# Patient Record
Sex: Male | Born: 1935 | Race: White | Hispanic: No | Marital: Married | Smoking: Former smoker
Health system: Southern US, Community
[De-identification: ages and names within clinical notes are randomized; demographics above are authoritative.]

## PROBLEM LIST (undated history)

## (undated) DIAGNOSIS — I739 Peripheral vascular disease, unspecified: Secondary | ICD-10-CM

## (undated) DIAGNOSIS — I2510 Atherosclerotic heart disease of native coronary artery without angina pectoris: Secondary | ICD-10-CM

## (undated) DIAGNOSIS — I872 Venous insufficiency (chronic) (peripheral): Secondary | ICD-10-CM

## (undated) DIAGNOSIS — I442 Atrioventricular block, complete: Secondary | ICD-10-CM

## (undated) DIAGNOSIS — I10 Essential (primary) hypertension: Secondary | ICD-10-CM

## (undated) DIAGNOSIS — Z952 Presence of prosthetic heart valve: Secondary | ICD-10-CM

## (undated) DIAGNOSIS — E785 Hyperlipidemia, unspecified: Secondary | ICD-10-CM

## (undated) DIAGNOSIS — E1143 Type 2 diabetes mellitus with diabetic autonomic (poly)neuropathy: Secondary | ICD-10-CM

## (undated) DIAGNOSIS — I4891 Unspecified atrial fibrillation: Secondary | ICD-10-CM

## (undated) DIAGNOSIS — E119 Type 2 diabetes mellitus without complications: Secondary | ICD-10-CM

## (undated) HISTORY — DX: Unspecified atrial fibrillation: I48.91

## (undated) HISTORY — DX: Essential (primary) hypertension: I10

## (undated) HISTORY — PX: FEMORAL ARTERY - FEMORAL ARTERY BYPASS GRAFT: SUR179

## (undated) HISTORY — DX: Venous insufficiency (chronic) (peripheral): I87.2

## (undated) HISTORY — DX: Type 2 diabetes mellitus without complications: E11.9

## (undated) HISTORY — DX: Atrioventricular block, complete: I44.2

## (undated) HISTORY — PX: CORONARY ARTERY BYPASS GRAFT: SHX141

## (undated) HISTORY — DX: Peripheral vascular disease, unspecified: I73.9

## (undated) HISTORY — DX: Hyperlipidemia, unspecified: E78.5

## (undated) HISTORY — DX: Type 2 diabetes mellitus with diabetic autonomic (poly)neuropathy: E11.43

## (undated) HISTORY — DX: Presence of prosthetic heart valve: Z95.2

## (undated) HISTORY — DX: Atherosclerotic heart disease of native coronary artery without angina pectoris: I25.10

---

## 1997-08-01 ENCOUNTER — Encounter (HOSPITAL_COMMUNITY): Admission: RE | Admit: 1997-08-01 | Discharge: 1997-10-30 | Payer: Self-pay | Admitting: Hematology and Oncology

## 1997-11-01 ENCOUNTER — Encounter (HOSPITAL_COMMUNITY): Admission: RE | Admit: 1997-11-01 | Discharge: 1998-01-30 | Payer: Self-pay | Admitting: Hematology and Oncology

## 1998-01-31 ENCOUNTER — Encounter (HOSPITAL_COMMUNITY): Admission: RE | Admit: 1998-01-31 | Discharge: 1998-05-01 | Payer: Self-pay | Admitting: Hematology and Oncology

## 1998-05-02 ENCOUNTER — Encounter (HOSPITAL_COMMUNITY): Admission: RE | Admit: 1998-05-02 | Discharge: 1998-07-31 | Payer: Self-pay | Admitting: Otolaryngology

## 1998-08-01 ENCOUNTER — Encounter (HOSPITAL_COMMUNITY): Admission: RE | Admit: 1998-08-01 | Discharge: 1998-10-30 | Payer: Self-pay | Admitting: Otolaryngology

## 1998-10-31 ENCOUNTER — Encounter (HOSPITAL_COMMUNITY): Admission: RE | Admit: 1998-10-31 | Discharge: 1998-12-01 | Payer: Self-pay | Admitting: Hematology and Oncology

## 1998-12-02 ENCOUNTER — Encounter (HOSPITAL_COMMUNITY): Admission: RE | Admit: 1998-12-02 | Discharge: 1999-03-02 | Payer: Self-pay | Admitting: Hematology and Oncology

## 2002-01-22 ENCOUNTER — Encounter (INDEPENDENT_AMBULATORY_CARE_PROVIDER_SITE_OTHER): Payer: Self-pay

## 2002-01-22 ENCOUNTER — Ambulatory Visit (HOSPITAL_COMMUNITY): Admission: RE | Admit: 2002-01-22 | Discharge: 2002-01-22 | Payer: Self-pay | Admitting: *Deleted

## 2002-11-13 ENCOUNTER — Encounter (INDEPENDENT_AMBULATORY_CARE_PROVIDER_SITE_OTHER): Payer: Self-pay | Admitting: Cardiology

## 2002-11-13 ENCOUNTER — Ambulatory Visit (HOSPITAL_COMMUNITY): Admission: RE | Admit: 2002-11-13 | Discharge: 2002-11-13 | Payer: Self-pay | Admitting: Cardiology

## 2004-12-14 ENCOUNTER — Inpatient Hospital Stay (HOSPITAL_COMMUNITY): Admission: EM | Admit: 2004-12-14 | Discharge: 2004-12-16 | Payer: Self-pay | Admitting: Emergency Medicine

## 2004-12-15 ENCOUNTER — Encounter (INDEPENDENT_AMBULATORY_CARE_PROVIDER_SITE_OTHER): Payer: Self-pay | Admitting: *Deleted

## 2005-05-23 ENCOUNTER — Inpatient Hospital Stay (HOSPITAL_COMMUNITY): Admission: EM | Admit: 2005-05-23 | Discharge: 2005-05-25 | Payer: Self-pay | Admitting: Emergency Medicine

## 2005-05-24 DIAGNOSIS — I442 Atrioventricular block, complete: Secondary | ICD-10-CM

## 2005-05-24 HISTORY — DX: Atrioventricular block, complete: I44.2

## 2005-05-24 HISTORY — PX: PACEMAKER INSERTION: SHX728

## 2005-06-17 ENCOUNTER — Encounter (HOSPITAL_COMMUNITY): Admission: RE | Admit: 2005-06-17 | Discharge: 2005-09-15 | Payer: Self-pay | Admitting: *Deleted

## 2005-09-16 ENCOUNTER — Encounter (HOSPITAL_COMMUNITY): Admission: RE | Admit: 2005-09-16 | Discharge: 2005-12-15 | Payer: Self-pay | Admitting: *Deleted

## 2008-08-27 ENCOUNTER — Ambulatory Visit: Admission: RE | Admit: 2008-08-27 | Discharge: 2008-08-27 | Payer: Self-pay | Admitting: Podiatry

## 2008-08-27 ENCOUNTER — Ambulatory Visit: Payer: Self-pay | Admitting: Vascular Surgery

## 2008-08-27 ENCOUNTER — Encounter (INDEPENDENT_AMBULATORY_CARE_PROVIDER_SITE_OTHER): Payer: Self-pay | Admitting: Podiatry

## 2009-05-29 ENCOUNTER — Encounter: Admission: RE | Admit: 2009-05-29 | Discharge: 2009-05-29 | Payer: Self-pay | Admitting: Foot & Ankle Surgery

## 2009-12-03 ENCOUNTER — Ambulatory Visit: Payer: Self-pay | Admitting: Cardiology

## 2009-12-31 ENCOUNTER — Ambulatory Visit: Payer: Self-pay | Admitting: *Deleted

## 2010-01-28 ENCOUNTER — Ambulatory Visit: Payer: Self-pay | Admitting: Cardiology

## 2010-02-25 ENCOUNTER — Ambulatory Visit: Payer: Self-pay | Admitting: Cardiology

## 2010-02-28 ENCOUNTER — Encounter: Payer: Self-pay | Admitting: Internal Medicine

## 2010-03-24 ENCOUNTER — Ambulatory Visit: Payer: Self-pay | Admitting: Cardiology

## 2010-04-01 ENCOUNTER — Encounter: Admission: RE | Admit: 2010-04-01 | Discharge: 2010-04-01 | Payer: Self-pay | Admitting: Internal Medicine

## 2010-04-08 ENCOUNTER — Ambulatory Visit: Payer: Self-pay | Admitting: Internal Medicine

## 2010-04-21 ENCOUNTER — Ambulatory Visit: Payer: Self-pay | Admitting: Cardiology

## 2010-05-06 ENCOUNTER — Ambulatory Visit: Payer: Self-pay | Admitting: Cardiology

## 2010-05-25 ENCOUNTER — Ambulatory Visit (HOSPITAL_COMMUNITY)
Admission: RE | Admit: 2010-05-25 | Discharge: 2010-05-25 | Payer: Self-pay | Source: Home / Self Care | Attending: Neurosurgery | Admitting: Neurosurgery

## 2010-06-02 ENCOUNTER — Ambulatory Visit: Payer: Self-pay | Admitting: Cardiology

## 2010-06-02 NOTE — Miscellaneous (Signed)
Summary: Device preload  Clinical Lists Changes  Observations: Added new observation of PPM INDICATN: CHB (02/28/2010 13:57) Added new observation of MAGNET RTE: BOL 85 ERI 65 (02/28/2010 13:57) Added new observation of PPMLEADSTAT2: active (02/28/2010 13:57) Added new observation of PPMLEADSER2: [id number] (02/28/2010 13:57) Added new observation of PPMLEADMOD2: 5076  (02/28/2010 13:57) Added new observation of PPMLEADDOI2: 05/24/2005  (02/28/2010 13:57) Added new observation of PPMLEADLOC2: RV  (02/28/2010 13:57) Added new observation of PPMLEADSTAT1: active  (02/28/2010 13:57) Added new observation of PPMLEADSER1: [id number]  (02/28/2010 13:57) Added new observation of PPMLEADMOD1: 5076  (02/28/2010 13:57) Added new observation of PPMLEADDOI1: 05/24/2005  (02/28/2010 13:57) Added new observation of PPMLEADLOC1: RA  (02/28/2010 13:57) Added new observation of PPM DOI: 05/24/2005  (02/28/2010 13:57) Added new observation of PPM SERL#: [id number]  (02/28/2010 13:57) Added new observation of PPM MODL#: [id number]  (02/28/2010 13:57) Added new observation of PACEMAKERMFG: Medtronic  (02/28/2010 13:57) Added new observation of PPM IMP MD: Charlynn Court  (02/28/2010 13:57) Added new observation of PPM REFER MD: Rolla Plate  (02/28/2010 13:57) Added new observation of PACEMAKER MD: Hillis Range, MD  (02/28/2010 13:57)      PPM Specifications Following MD:  Hillis Range, MD     Referring MD:  Rolla Plate PPM Vendor:  Medtronic     PPM Model Number:  [id number]     PPM Serial Number:  [id number] PPM DOI:  05/24/2005     PPM Implanting MD:  Charlynn Court  Lead 1    Location: RA     DOI: 05/24/2005     Model #: [id number]     Serial #: [id number]     Status: active Lead 2    Location: RV     DOI: 05/24/2005     Model #: [id number]     Serial #: [id number]     Status: active  Magnet Response Rate:  BOL 85 ERI 65  Indications:  CHB

## 2010-06-04 NOTE — Procedures (Signed)
Summary: pacer check/medtronic   Current Medications (verified): 1)  Metoprolol Tartrate 50 Mg Tabs (Metoprolol Tartrate) .... Take 1 Tablet By Mouth Two Times A Day 2)  Furosemide 40 Mg Tabs (Furosemide) .... Take 1 Tablet By Mouth Two Times A Day 3)  Diovan 160 Mg Tabs (Valsartan) .... Take 1 Tablet By Mouth Once Daily 4)  Metformin Hcl 500 Mg Tabs (Metformin Hcl) .... Take 2 Tablets By Mouth At Breakfast 5)  Actos 15 Mg Tabs (Pioglitazone Hcl) .... Take 1 Tablet By Mouth At Dinner 6)  Lipitor 40 Mg Tabs (Atorvastatin Calcium) .... Take 1 Tablet By Mouth At Bedtime 7)  Aspirin 81 Mg Tbec (Aspirin) .... Take 1 Tablet By Mouth Once Daily 8)  Folic Acid 1 Mg Tabs (Folic Acid) .... Take 1 Tablet By Mouth Once Daily 9)  Lumigan 0.03 % Soln (Bimatoprost) .... One Drop in Each At Bedtime 10)  Vitamin D 400 Unit Tabs (Cholecalciferol) .... Take 1 Tablet By Mouth Once Daily 11)  Fish Oil Concentrate 1000 Mg Caps (Omega-3 Fatty Acids) .... Take 2 Tablets By Mouth Once Daily 12)  Warfarin Sodium 5 Mg Tabs (Warfarin Sodium) .... Take As Directed By Coumadin Clinic 13)  Tylenol Extra Strength 500 Mg Tabs (Acetaminophen) .... Take As Needed 14)  Aleve 220 Mg Tabs (Naproxen Sodium) .... Take As Needed  Allergies (verified): No Known Drug Allergies  PPM Specifications Following MD:  Hillis Range, MD     Referring MD:  Rolla Plate PPM Vendor:  Medtronic     PPM Model Number:  [id number]     PPM Serial Number:  [id number] PPM DOI:  05/24/2005     PPM Implanting MD:  Charlynn Court  Lead 1    Location: RA     DOI: 05/24/2005     Model #: [id number]     Serial #: [id number]     Status: active Lead 2    Location: RV     DOI: 05/24/2005     Model #: [id number]     Serial #: [id number]     Status: active  Magnet Response Rate:  BOL 85 ERI 65  Indications:  CHB   PPM Follow Up Battery Voltage:  2.92 V       PPM Device Measurements Atrium  Amplitude: 0.8 mV, Impedance: 464 ohms,  Right Ventricle   Amplitude: 13.9 mV, Impedance: 536 ohms, Threshold: 1.0 V at 0.4 msec  Episodes Percent Mode Switch:  100%     Coumadin:  Yes Ventricular High Rate:  0     Ventricular Pacing:  92.8%  Parameters Mode:  VVIR     Lower Rate Limit:  70     Next Cardiology Appt Due:  07/30/2010 Tech Comments:  [place] [facility] PT---PT IN AF 100% OF TIME. + COUMADIN.  NORMAL DEVICE FUNCTION.  NO CHANGES MADE. ROV 07-30-10 @ 1200 W/JA. PT ENROLLED IN [facility] AND WILL START TRANSMISSIONS AFTER JA OV IN [facility]. Vella Kohler  April 09, 2010 8:32 AM

## 2010-06-04 NOTE — Cardiovascular Report (Signed)
Summary: Office Visit   Office Visit   Imported By: Roderic Ovens 04/14/2010 09:12:25  _____________________________________________________________________  External Attachment:    Type:   Image     Comment:   External Document

## 2010-06-25 HISTORY — PX: CAROTID ENDARTERECTOMY: SUR193

## 2010-07-13 ENCOUNTER — Encounter (INDEPENDENT_AMBULATORY_CARE_PROVIDER_SITE_OTHER): Payer: PRIVATE HEALTH INSURANCE

## 2010-07-13 DIAGNOSIS — Z7901 Long term (current) use of anticoagulants: Secondary | ICD-10-CM

## 2010-07-13 DIAGNOSIS — I4891 Unspecified atrial fibrillation: Secondary | ICD-10-CM

## 2010-07-27 ENCOUNTER — Other Ambulatory Visit (INDEPENDENT_AMBULATORY_CARE_PROVIDER_SITE_OTHER): Payer: Medicare Other | Admitting: *Deleted

## 2010-07-27 DIAGNOSIS — Z7901 Long term (current) use of anticoagulants: Secondary | ICD-10-CM

## 2010-07-27 DIAGNOSIS — I4891 Unspecified atrial fibrillation: Secondary | ICD-10-CM | POA: Insufficient documentation

## 2010-07-30 ENCOUNTER — Encounter: Payer: Self-pay | Admitting: Internal Medicine

## 2010-07-30 ENCOUNTER — Ambulatory Visit (INDEPENDENT_AMBULATORY_CARE_PROVIDER_SITE_OTHER): Payer: Medicare Other | Admitting: Internal Medicine

## 2010-07-30 VITALS — BP 120/62 | HR 76 | Ht 72.0 in | Wt 237.0 lb

## 2010-07-30 DIAGNOSIS — Z952 Presence of prosthetic heart valve: Secondary | ICD-10-CM

## 2010-07-30 DIAGNOSIS — I4891 Unspecified atrial fibrillation: Secondary | ICD-10-CM

## 2010-07-30 DIAGNOSIS — I359 Nonrheumatic aortic valve disorder, unspecified: Secondary | ICD-10-CM

## 2010-07-30 DIAGNOSIS — I10 Essential (primary) hypertension: Secondary | ICD-10-CM

## 2010-07-30 DIAGNOSIS — I2510 Atherosclerotic heart disease of native coronary artery without angina pectoris: Secondary | ICD-10-CM | POA: Insufficient documentation

## 2010-07-30 DIAGNOSIS — I442 Atrioventricular block, complete: Secondary | ICD-10-CM

## 2010-07-30 DIAGNOSIS — I739 Peripheral vascular disease, unspecified: Secondary | ICD-10-CM | POA: Insufficient documentation

## 2010-07-30 NOTE — Assessment & Plan Note (Signed)
Permanent afib Well rate controlled Continue coumadin longterm

## 2010-07-30 NOTE — Assessment & Plan Note (Signed)
Normal pacemaker function No changes today See paceart report 

## 2010-07-30 NOTE — Assessment & Plan Note (Signed)
Stable No changes Salt restriction Advised for venous insufficiency

## 2010-07-30 NOTE — Assessment & Plan Note (Signed)
No ischemic symptoms No changes today 

## 2010-07-30 NOTE — Progress Notes (Signed)
The patient presents today to establish electrophysiology care in the device clinic.  He has a h/o permanent atrial fibrillation and complete heart block and is s/p PPM (MDT) by Dr Reyes Ivan 05/24/2005.  He also has a h/o PVD, CEA, and is s/p CABG/AVR.  He remains quite active presently.  He has chronic stable edema due to venous insufficiency.  He also has diabetes.  Today, he denies symptoms of palpitations, chest pain, shortness of breath, orthopnea, PND,  dizziness, presyncope, syncope, or neurologic sequela.  He recently underwent R CEA and feels that his recovery has been very good.  The patient feels that he is tolerating medications without difficulties and is otherwise without complaint today.   Past Medical History  Diagnosis Date  . Complete heart block 05/24/2005    Pacemaker (MDT) implanted by Dr Reyes Ivan  . CAD (coronary artery disease)     s/p CABG  . PVD (peripheral vascular disease)     s/p remote bilateral femoral bypass at [facility], CEA at [facility]  . HTN (hypertension)   . DM (diabetes mellitus)   . Peripheral autonomic neuropathy due to DM   . Venous insufficiency   . S/P AVR (aortic valve replacement)     mechanical valve  . A-fib     Permanent afib  . Hyperlipidemia    Past Surgical History  Procedure Date  . Insert / replace / remove pacemaker 05/24/2005    MDT by Dr Reyes Ivan  . Coronary artery bypass graft     at [facility]  . Carotid endarterectomy 06/25/10    at [facility]  . Femoral artery - femoral artery bypass graft     remote    Current outpatient prescriptions:aspirin 81 MG tablet, Take 81 mg by mouth daily.  , Disp: , Rfl: ;  atorvastatin (LIPITOR) 40 MG tablet, Take 40 mg by mouth at bedtime.  , Disp: , Rfl: ;  bimatoprost (LUMIGAN) 0.03 % ophthalmic drops, Place 1 drop into both eyes at bedtime.  , Disp: , Rfl: ;  folic acid (FOLVITE) 1 MG tablet, Take 1 mg by mouth daily.  , Disp: , Rfl:  furosemide (LASIX) 40 MG tablet, Take 40 mg by mouth 2 (two) times daily.  , Disp: ,  Rfl: ;  metFORMIN (GLUCOPHAGE) 500 MG tablet, Take 1,500 mg by mouth. 2 tablets in the morning and 1 tablet in the evening to make 1500 mg a day., Disp: , Rfl: ;  metoprolol (LOPRESSOR) 50 MG tablet, Take 50 mg by mouth 2 (two) times daily.  , Disp: , Rfl: ;  Multiple Vitamin (MULTIVITAMIN) tablet, Take 1 tablet by mouth daily.  , Disp: , Rfl:  Omega-3 Fatty Acids (FISH OIL) 1000 MG CAPS, Take 2 capsules by mouth daily.  , Disp: , Rfl: ;  valsartan (DIOVAN) 160 MG tablet, Take 160 mg by mouth daily.  , Disp: , Rfl: ;  vitamin D, CHOLECALCIFEROL, 400 UNITS tablet, Take 400 Units by mouth daily.  , Disp: , Rfl: ;  warfarin (COUMADIN) 5 MG tablet, Take 5 mg by mouth daily. Take as directed by coumadin clinic , Disp: , Rfl:  DISCONTD: acetaminophen (TYLENOL) 500 MG tablet, Take 500 mg by mouth as needed.  , Disp: , Rfl: ;  DISCONTD: naproxen sodium (ALEVE) 220 MG tablet, Take 220 mg by mouth as needed.  , Disp: , Rfl: ;  DISCONTD: pioglitazone (ACTOS) 15 MG tablet, Take 15 mg by mouth daily.  , Disp: , Rfl:   No Known Allergies  History   Social History  . Marital Status: Married    Spouse Name: N/A    Number of Children: N/A  . Years of Education: N/A   Occupational History  . Not on file.   Social History Main Topics  . Smoking status: Former Games developer  . Smokeless tobacco: Not on file  . Alcohol Use: No  . Drug Use: No  . Sexually Active: Not on file   Other Topics Concern  . Not on file   Social History Narrative  . No narrative on file    Family History  Problem Relation Age of Onset  . Coronary artery disease      ROS-  All systems are reviewed and are negative except as outlined in the HPI above   Physical Exam: Filed Vitals:   07/30/10 1119  BP: 120/62  Pulse: 76  Height: 6' (1.829 m)  Weight: 237 lb (107.502 kg)    GEN- The patient is well appearing, alert and oriented x 3 today.   Head- normocephalic, atraumatic Eyes-  Sclera clear, conjunctiva pink Ears- hearing  intact Oropharynx- clear Neck- supple, no JVP, R CEA scar is healing nicely Lymph- no cervical lymphadenopathy Lungs- Clear to ausculation bilaterally, normal work of breathing Chest- L sided pacemaker pocket is well healed Heart- Regular rate and rhythm (paced),  PMI not laterally displaced GI- soft, NT, ND, + BS Extremities- no clubbing, cyanosis, 2+ chronic edema MS- no significant deformity or atrophy Skin- no rash or lesion Psych- euthymic mood, full affect Neuro- strength and sensation are intact

## 2010-07-30 NOTE — Patient Instructions (Signed)
Your physician wants you to follow-up in: 12 months with Dr Jacquiline Doe will receive a reminder letter in the mail two months in advance. If you don't receive a letter, please call our office to schedule the follow-up appointment.  Remote monitoring is used to monitor your Pacemaker of ICD from home. This monitoring reduces the number of office visits required to check your device to one time per year. It allows [place] to keep an eye on the functioning of your device to ensure it is working properly. You are scheduled for a device check from home on 10/29/2010. You may send your transmission at any time that day. If you have a wireless device, the transmission will be sent automatically. After your physician reviews your transmission, you will receive a postcard with your next transmission date.

## 2010-08-31 ENCOUNTER — Ambulatory Visit: Payer: Medicare Other | Admitting: Internal Medicine

## 2010-08-31 ENCOUNTER — Ambulatory Visit (INDEPENDENT_AMBULATORY_CARE_PROVIDER_SITE_OTHER): Payer: Medicare Other | Admitting: *Deleted

## 2010-08-31 DIAGNOSIS — I4891 Unspecified atrial fibrillation: Secondary | ICD-10-CM

## 2010-08-31 DIAGNOSIS — Z7901 Long term (current) use of anticoagulants: Secondary | ICD-10-CM

## 2010-09-18 NOTE — Cardiovascular Report (Signed)
NAME:  Jeffrey Whitney, POKE NO.:  [id number]   MEDICAL RECORD NO.:  [id number]          PATIENT TYPE:  INP   LOCATION:  2008                         FACILITY:  [facility]   PHYSICIAN:  Elmore Guise., M.D.DATE OF BIRTH:  May 04, 1930   DATE OF PROCEDURE:  05/24/2005  DATE OF DISCHARGE:                              CARDIAC CATHETERIZATION   INDICATIONS FOR PROCEDURE:  Complete heart block.   DESCRIPTION OF PROCEDURE:  The patient was brought to the cardiac cath lab  after appropriate informed consent. He is prepped and draped in a sterile  fashion. Approximately 20 cc of 1% lidocaine was used for local anesthesia.  A two inch incision was made in the left deltopectoral groove. A  subcutaneous pocket was then made with blunt and Bovie dissection. A  venogram was then performed showing the root of the left axillary/subclavian  vein. The left axillary vein was then accessed in two separate sticks. Two 7-  [place] safety sheaths were placed over the retained wires. The ventricular  lead, the Medtronic active fixation 5076-58 cm, serial number [id number] was  placed in the right ventricular septum with the following measurements,  threshold 0.8 volts at 0.5 milliseconds, impedance of 969 ohms. R-waves  measured 12.0 mV with a current width of 1.1 mA. 10 volts check was  negative. Suture was then sewn into the pocket. The atrial lead was then  placed. Atrial lead is a Medtronic 5076-52 cm, serial number [id number] with  the following measurements; threshold of 1.2 volts at 0.5 milliseconds with  impedance 832 ohms. P-waves measured 7.8 mV, current of 1.9 mA, 10 volts  check was negative. Atrial lead was then sewn into the pocket. The atrial  and ventricular leads were appropriately identified and placed to Enrhythm  dual-chamber permanent pacemaker, serial number [id number] generator was  sewn into the pocket. The pocket was copiously irrigated with kanamycin  solution. Pocket  was then closed with 2-0 followed by 2-0 followed by a 4-0  Vicryl suture. There was minimal oozing on placing the subcutaneous sutures  which improved with pressure dressing. The patient tolerated the procedure  well. No apparent complications. His temporary pacer was removed from his  left femoral region under fluoroscopic guidance with no changes in the  atrial or ventricular leads noted. The patient will be transferred to the  CCU in stable condition.      Elmore Guise., M.D.  Electronically Signed     TWK/MEDQ  D:  05/24/2005  T:  05/24/2005  Job:  [id number]

## 2010-09-18 NOTE — Op Note (Signed)
   NAME:  Jeffrey Whitney, Jeffrey Whitney                        ACCOUNT NO.:  [id number]   MEDICAL RECORD NO.:  [id number]                   PATIENT TYPE:  AMB   LOCATION:  ENDO                                 FACILITY:  [facility]   PHYSICIAN:  Georgiana Spinner, M.D.                 DATE OF BIRTH:  08-01-1930   DATE OF PROCEDURE:  DATE OF DISCHARGE:                                 OPERATIVE REPORT   PROCEDURE:  Colonoscopy.   INDICATIONS FOR PROCEDURE:  Hemoccult positivity, colon cancer screening.   ANESTHESIA:  Demerol 10, Versed 2 mg.   DESCRIPTION OF PROCEDURE:  With the patient mildly sedated in the left  lateral decubitus position, the Olympus videoscopic colonoscope was inserted  in the rectum and passed under direct vision to the cecum identified by the  ileocecal valve and appendiceal orifice both of which were photographed.  From this point, the colonoscope was slowly withdrawn taking circumferential  views of the entire colonic mucosa stopping only to photograph  diverticulosis seen along the way in the sigmoid colon. The rectum appeared  normal on direct and retroflexed view. The endoscope was straightened and  withdrawn. The patient's vital signs and pulse oximeter remained stable. The  patient tolerated the procedure well without apparent complications.   FINDINGS:  Diverticulosis of the sigmoid colon, otherwise, unremarkable  exam.   PLAN:  See endoscopy note for further details.                                                Georgiana Spinner, M.D.    GMO/MEDQ  D:  01/22/2002  T:  01/22/2002  Job:  [id number]

## 2010-09-18 NOTE — Op Note (Signed)
   NAME:  Jeffrey Whitney, Jeffrey Whitney                        ACCOUNT NO.:  [id number]   MEDICAL RECORD NO.:  [id number]                   PATIENT TYPE:  AMB   LOCATION:  ENDO                                 FACILITY:  [facility]   PHYSICIAN:  Georgiana Spinner, M.D.                 DATE OF BIRTH:  [birth year]   DATE OF PROCEDURE:  DATE OF DISCHARGE:                                 OPERATIVE REPORT   PROCEDURE:  Upper endoscopy.   INDICATIONS FOR PROCEDURE:  Gastroesophageal reflux disease.   ANESTHESIA:  Demerol 50, Versed 5 mg.   DESCRIPTION OF PROCEDURE:  With the patient mildly sedated in the left  lateral decubitus position, the Olympus videoscopic endoscope was inserted  in the mouth, passed under direct vision through the esophagus. The distal  esophagus was approached and there was a question of Barrett's esophagus  seen, photographed and biopsied. We entered into the stomach. The fundus,  body, antrum, duodenal bulb, second portion of the duodenum appeared normal.  From this point, the endoscope was slowly withdrawn taking circumferential  views of the entire gastric and esophageal mucosa. The patient's vital signs  and pulse oximeter remained stable. The patient tolerated the procedure well  without apparent complications.   FINDINGS:  Questionable area of Barrett's esophagus seen photographed and  biopsied. Await biopsy report. The patient will call me for results and  followup with me as an outpatient. Proceed to colonoscopy as planned.                                               Georgiana Spinner, M.D.    GMO/MEDQ  D:  01/22/2002  T:  01/22/2002  Job:  [id number]

## 2010-09-18 NOTE — Discharge Summary (Signed)
NAME:  Jeffrey Whitney, HOLCOMB NO.:  [id number]   MEDICAL RECORD NO.:  [id number]          PATIENT TYPE:  INP   LOCATION:  2008                         FACILITY:  [facility]   PHYSICIAN:  Elmore Guise., M.D.DATE OF BIRTH:  04/02/1931   DATE OF ADMISSION:  05/22/2005  DATE OF DISCHARGE:  05/25/2005                                 DISCHARGE SUMMARY   DISCHARGE DIAGNOSES:  1.  Third-degree heart block status post dual-chamber permanent pacemaker      implant.  2.  History of severe aortic stenosis status post bioprosthetic aortic valve      replacement in [facility] December 2006.  3.  History of paroxysmal atrial fibrillation.  4.  History of hypertension.  5.  Diabetes mellitus.  6.  Dyslipidemia.   HISTORY OF PRESENT ILLNESS:  The patient is a very pleasant 77 year old  white male who is one month status post aortic valve replacement at [facility]. He presented with presyncope and bradycardia. On  arrival to the ER, he was found to be in complete heart block with a  ventricular escape rhythm in the low 30s.   HOSPITAL COURSE:  The patient was admitted to the CCU. A temporary pacemaker  was placed by Dr. Garnette Scheuermann for further slowing in his heart rate to less  than 30 beats per minute. Following his temporary pacer insertion, the  patient did well. His blood pressure remained stable at that time. The  patient underwent dual-chamber permanent pacemaker implant on May 24, 2005. He tolerated this procedure well. His temporary pacer was taken out  under fluoroscopic guidance prior to closing his pacemaker pocket. His  postprocedure x-ray showed appropriate position in the right atrial  appendage and RV septum of his atrial and ventricular leads. His pacemaker  was interrogated and functioned appropriately. On telemetry, he has been  atrially sensed and ventricularly paced. His thresholds were 0.5 volts at  0.2 milliseconds  in both the atrial and ventricular chambers. He had no  atrial arrhythmias during his hospitalization. Postprocedure, his insertion  site was healing well. His bandage was removed. His Steri-Strips were left  in place. There was mild swelling; however, no bleeding and no hematoma. He  was noted to have an IV infiltrate on his right forearm and this was removed  immediately. He was given routine post pacemaker restrictions including to  keep his area clean and dry. No showers for the next week. He was also  instructed not to lift anything heavy more than 10 pounds in his left arm or  lift his left arm over shoulder level for the next two weeks.   DISCHARGE MEDICATIONS:  1.  Coreg 3.125 milligrams b.i.d.  2.  Lasix 20 milligrams b.i.d.  3.  Benazepril 20 milligrams daily.  4.  Amiodarone 200 milligrams daily.  5.  Folic acid 1 milligram daily.  6.  Iron sulfate 325 milligrams daily.  7.  Metformin 500 milligrams daily.  8.  Lipitor 20 milligrams daily.  9.  Aspirin 81 milligrams daily.  10. Coumadin as before.  11.  Multivitamin one time daily.  12. Augmentin 875 milligrams b.i.d. for the next 3 days.   His return appointment will be with Dr. Lady Deutscher at [facility]  Cardiology in one week. He will also see Dr. Lendell Caprice at [facility] for routine follow-up later this week. He is to call the office  with any questions or concerns.      Elmore Guise., M.D.  Electronically Signed     TWK/MEDQ  D:  05/25/2005  T:  05/26/2005  Job:  [id number]

## 2010-09-18 NOTE — Op Note (Signed)
NAME:  Jeffrey Whitney, Jeffrey Whitney NO.:  [id number]   MEDICAL RECORD NO.:  [id number]          PATIENT TYPE:  INP   LOCATION:  4741                         FACILITY:  [facility]   PHYSICIAN:  Elmore Guise., M.D.DATE OF BIRTH:  08-16-1930   DATE OF PROCEDURE:  12/15/2004  DATE OF DISCHARGE:                                 OPERATIVE REPORT   PROCEDURE:  Cardioversion.   INDICATION FOR PROCEDURE:  New onset atrial fibrillation.   PROCEDURE DESCRIPTION:  Patient was brought to the endoscopy lab.  A TEE was  performed after appropriate conscious sedation was given.  TEE was without  any evidence of left atrial thrombus or left atrial appendage thrombus.  He  was noted to have severe aortic stenosis with aortic valve area of 0.7 cm2  and moderate to severe mitral regurgitation.  Because of little cardiac  reserve, patient was deemed an appropriate candidate for electrical  cardioversion.  Anesthesia was notified.  Patient was given sedation via  anesthesia.  He then underwent DC cardioversion with 200 joules biphasic  with his pads placed in the anterior and posterior position.  Cardioversion  was successful.  No complications during the procedure.      Elmore Guise., M.D.  Electronically Signed     TWK/MEDQ  D:  12/16/2004  T:  12/16/2004  Job:  [id number]   cc:   Lendell Caprice, Dr.  [facility]

## 2010-09-18 NOTE — Cardiovascular Report (Signed)
NAME:  Jeffrey Whitney, Jeffrey Whitney NO.:  [id number]   MEDICAL RECORD NO.:  [id number]          PATIENT TYPE:  INP   LOCATION:  2915                         FACILITY:  [facility]   PHYSICIAN:  Lyn Records, M.D.   DATE OF BIRTH:  01-15-1931   DATE OF PROCEDURE:  05/23/2005  DATE OF DISCHARGE:                              CARDIAC CATHETERIZATION   PROCEDURE PERFORMED:  Temporary transvenous pacemaker.   INDICATIONS:  Hemodynamically stable third-degree heart block with  ventricular rate of 30 beats per minute.   PROCEDURE PERFORMED:  Temporary transvenous pacemaker via left femoral vein.   DESCRIPTION:  The patient was brought to the cath lab because of  coagulopathy from Coumadin. We felt that we would be more likely to control  bleeding should this occur from the femoral venous approach rather than his  neck or the subclavian approach.   Xylocaine 1% was used for local anesthesia and with a single anterior vein  stick a venous sheath was inserted using the modified Seldinger technique.  We then successfully floated a balloon-tipped 4-French pacing catheter to  the RV apex and achieved excellent threshold of less than 0.5 mA. We set the  pacer at rate of 70 MA of three in the VVI mode.   No complications occurred. Hemostasis was adequate.   CONCLUSION:  Successful placement of temporary transvenous pacemaker via  right femoral vein.   PLAN:  Permanent pacemaker per Dr. Lady Deutscher in the next several day.      Lyn Records, M.D.  Electronically Signed     HWS/MEDQ  D:  05/23/2005  T:  05/24/2005  Job:  [id number]   cc:   Silvestre Mesi, M.D.  Cardiothoracic Surgery at [facility], M.D.  Cardiology Division at [facility] C. Eloise Harman., M.D.  Fax: [phone]   Elmore Guise., M.D.  Fax: [phone]

## 2010-09-18 NOTE — H&P (Signed)
NAME:  Jeffrey Whitney, Jeffrey Whitney NO.:  [id number]   MEDICAL RECORD NO.:  [id number]          PATIENT TYPE:  EMS   LOCATION:  MAJO                         FACILITY:  [facility]   PHYSICIAN:  Elmore Guise., M.D.DATE OF BIRTH:  Apr 07, 1931   DATE OF ADMISSION:  12/14/2004  DATE OF DISCHARGE:                                HISTORY & PHYSICAL   INDICATION FOR ADMISSION:  New onset atrial fibrillation and history of  aortic valve stenosis.   PRIMARY CARE PHYSICIAN:  Dr. Lendell Caprice at [facility].   HISTORY OF PRESENT ILLNESS:  The patient is a 77 year old white male with  past medical history of coronary artery disease (status post coronary artery  bypass grafting x2 in 1983 and then again in 1996), history of mild to  moderate aortic stenosis with aortic valve area ranging from 1.4-1.6 by a  TEE done in 2004, morbid obesity, diabetes mellitus, dyslipidemia,  obstructive sleep apnea, who presents with a 2-week history of increased  dyspnea on exertion, decrease in exertional tolerance, and orthopnea. The  patient also reports mild lower extremity edema. However, this has been  stable since his bypasses back in 1996. He denies any cough or chest pain.  He reports, I just can't do anything.  He reports that his heart skips at  times; however, it has been skipping for some time. However, he was just  recently diagnosed with atrial fibrillation back on August8, 2006.  He was  started on Coumadin at that time.  His initial INR was 1.1.  A repeat INR  has not been performed. He was also started on Cardizem, and he has had 2  follow-up visits, and he continues to have significant dyspnea on exertion,  as well as no exercise tolerance.   REVIEW OF SYSTEMS:  Otherwise negative.   CURRENT MEDICATIONS:  1.  Glucophage 500 milligrams b.i.d.  2.  Cardizem 240 milligrams daily.  3.  Metoprolol 100 milligrams b.i.d.  4.  Lipitor, unknown dose.  5.  Zyrtec 10 milligrams daily.   ALLERGIES:  Questionable intolerance to ACE (the patient recently with hives  approximately 2 weeks ago).   FAMILY HISTORY:  Positive for coronary disease, stroke, as well as PE.   SOCIAL HISTORY:  Has been married for 50+ years.  History of tobacco (quit  in 1983) and occasional alcohol use.   PHYSICAL EXAMINATION:  VITAL SIGNS:  Temperature is 97.3, heart rate is in  the 80s, atrial fibrillation on telemetry, blood pressure is 135-160 over  70s, satting 98% on room air.  GENERAL:  He is very pleasant elderly white male, alert and oriented x4, in  no acute distress.  NECK:  He has no JVD. He does have radiation of an AS murmur to bilateral  carotids, right greater than left.  LUNGS:  Clear.  HEART:  Irregular irregular with 3/6 systolic ejection murmur noted.  ABDOMEN:  Obese, soft, nontender, nondistended.  EXTREMITIES:  Warm with 1+ edema and 1+ pedal pulses.  NEUROLOGICAL:  He is intact with no significant focal deficits.   LABORATORY DATA:  Lab work shows a white  count of 11.3, hemoglobin of 15.6,  platelets of 179, BUN and creatinine of 17 and 0.7, potassium of 4.0. BNP is  353. His cardiac enzymes are negative times one. Chest x-ray shows no acute  cardiopulmonary disease; mild cardiomegaly was noted. The patient did have a  TEE done by Dr. Yates Decamp back in 2004 which showed preserved LV systolic  function with an EF of 55-65%, mild to moderate aortic stenosis by  pressures, and VTI aortic valve area was estimated at approximately 1.5 cm2  by  planimetry valve area was estimated 1.8 cm2. He had mild atherosclerotic  disease in the ascending and descending aorta, mild mitral annular  calcification, with mild to moderate mitral valve regurgitation. He had  normal left atrial size.   IMPRESSION:  1.  New onset atrial fibrillation.  2.  History of mild to moderate aortic stenosis.   PLAN:  1.  For new onset atrial fibrillation, will admit to telemetry. Will check a       PT/INR.  If INR < 2, will start Lovenox 1 mg/kg subcu b.i.d.  We will      schedule the patient for TEE, with possible cardioversion in the      morning.  Will start him on amiodarone 400 milligrams p.o. t.i.d.. Will      check LFTs, as well as TSH. Will repeat cardiac enzymes. I discussed      inpatient observation and TEE with cardioversion to help with the      symptoms. The patient understands and agrees to proceed with treatment      options.      Elmore Guise., M.D.  Electronically Signed     TWK/MEDQ  D:  12/14/2004  T:  12/14/2004  Job:  [id number]

## 2010-09-18 NOTE — H&P (Signed)
NAME:  Jeffrey Whitney, Jeffrey Whitney NO.:  [id number]   MEDICAL RECORD NO.:  [id number]          PATIENT TYPE:  INP   LOCATION:  2915                         FACILITY:  [facility]   PHYSICIAN:  Lyn Records, M.D.   DATE OF BIRTH:  April 24, 1931   DATE OF ADMISSION:  05/22/2005  DATE OF DISCHARGE:                                HISTORY & PHYSICAL   REASON FOR ADMISSION:  Bradycardia and fatigue.   HISTORY OF PRESENT ILLNESS:  The patient is 77 years of age and has a  history of aortic valve disease and coronary artery disease.  He is status  post aortic valve replacement in December 2006 for calcific aortic stenosis.  He had also previously undergone coronary artery bypass grafting in 1983 and  again in 1996.  He apparently received a tissue (Bovine) prosthetic valve.  He has a history of paroxysmal atrial fibrillation and was cardioverted last  summer by Dr. Reyes Ivan.  He is admitted at this time after 24-36 hour history  of bradycardia and fatigue on exertion.  He noted low heart rates in the 30s  when he checked his blood pressure.  He spoke with the on call cardiologist  at [facility] and was instructed to come to the emergency room.  I was called to  see him at around 10:30 p.m. January 20, 20007.  The patient at rest is  asymptomatic.  He is able to lay flat.  He has not had syncope.  There is no  chest pain or shortness of breath.  Overall he feels well but just states  that he cannot stir around and do very much without feeling like he is  extremely fatigued.   ALLERGIES:  None.   HABITS:  Does not drink.  Does not smoke.   SIGNIFICANT MEDICAL PROBLEMS:  1.  Type 2 diabetes.  2.  Hypertension.  3.  Hypercholesterolemia.  4.  Glaucoma.  5.  Status post umbilical herniorrhaphy.  6.  History of atrial fibrillation status post cardioversion and initiation      of amiodarone therapy by Dr. Reyes Ivan in the summer of 2006.  7.  Coronary atherosclerotic heart disease status post  coronary artery      bypass grafting in 1983 and again in 1996.  8.  Status post aortic valve replacement by Dr. Silvestre Mesi at [facility] using a      tissue prosthesis in December 2006.   FAMILY HISTORY:  Noncontributory.   REVIEW OF SYSTEMS:  Noncontributory.   SOCIAL HISTORY:  The patient is a retired Personnel officer.   CURRENT MEDICATIONS:  1.  Coreg 3.125 mg twice daily.  2.  Furosemide 20 mg twice daily.  3.  Benazepril 20 mg daily.  4.  Amiodarone 200 mg daily.  5.  Folic acid 1 mg daily.  6.  Metformin 500 mg twice daily.  7.  KCl 20 mEq twice daily.  8.  Lipitor 20 mg per day.  9.  Coumadin taken as directed, 4 mg two days a week and 5 mg the other      days.  10. Aspirin 81 mg per  day.  11. Multivitamins one per day.  12. Tylenol as needed.   OBJECTIVE:  VITAL SIGNS:  The patient is in no acute distress.  His blood  pressure is 130/70, heart rate 30, he is in third degree heart block based  on the monitor and his 12-lead.  His respiratory rate  is 16, he is  afebrile.  The skin is of normal color, normal temperature, no diaphoresis.  HEENT:  Unremarkable.  NECK:  No JVD or carotid bruits.  LUNGS:  Clear to auscultation and percussion.  CARDIAC:  A 1/6 systolic murmur, no diastolic murmur.  ABDOMEN:  Soft.  Bowel sounds are normal.  EXTREMITIES:  No edema.  NEUROLOGIC:  Reveals no focal deficits.   LABORATORY DATA:  Potassium 3.9, creatinine 1.3, BNP 512, INR 2.5 (no  Coumadin yet today).   ASSESSMENT:  1.  Third degree heart block likely secondary to intrinsic AV node disease      and aggravated by amiodarone and Coreg.  2.  Status post aortic valve replacement with a Bovine prosthesis in      December 2006 by [facility]vest[facility]  3.  Coronary atherosclerotic heart disease status post coronary artery      bypass grafting in 1983 and repeated in 1996.  Apparently grafts were      patent at the time of aortic valve surgery.  4.  Coagulopathy secondary to Coumadin with INR  of 2.5.   PLAN:  1.  Transcutaneous pacemaker backup this evening.  2.  IV Vitamin K.  3.  Transvenous pacemaker when INR is a little bit lower unless the patient      becomes symptomatic.  4.  The patient will probably require a permanent pacemaker as he has a      prior history of atrial fibrillation and requires amiodarone therapy for      maintenance of sinus rhythm.      Lyn Records, M.D.  Electronically Signed     HWS/MEDQ  D:  05/23/2005  T:  05/24/2005  Job:  [id number]   cc:   Janae Bridgeman. Eloise Harman., M.D.  Fax: [phone]   Elmore Guise., M.D.  Fax: [phone]   at [facility] and to Dr. Silvestre Mesi in the cardiothoracic surgical section at [facility] DR.  Romeo Apple in cardiovascular section

## 2010-09-18 NOTE — Discharge Summary (Signed)
NAME:  Jeffrey Whitney, Jeffrey Whitney NO.:  [id number]   MEDICAL RECORD NO.:  [id number]          PATIENT TYPE:  INP   LOCATION:  4741                         FACILITY:  [facility]   PHYSICIAN:  Elmore Guise., M.D.DATE OF BIRTH:  30-Jul-1930   DATE OF ADMISSION:  12/14/2004  DATE OF DISCHARGE:  12/16/2004                                 DISCHARGE SUMMARY   DISCHARGE DIAGNOSIS:  1.  Severe aortic stenosis.  2.  Atrial fibrillation.  3.  Status post successful DC cardioversion.  4.  Hypertension.  5.  History of coronary disease status post coronary artery bypass surgery      x2.  6.  Dyslipidemia.  7.  Diabetes mellitus.  8.  Obesity.   HISTORY OF PRESENT ILLNESS:  The patient is a very pleasant 77 year old  white male who presented on December 14, 2004 with chief complaint of  progressive shortness of breath.  Was found to be in new onset atrial  fibrillation.  He was admitted for observation and treatment.  He was  started on Lovenox and Coumadin.  He underwent TEE cardioversion on December 15, 2004 which was successful.  He has done well overnight and will be  discharged home today.   DISCHARGE MEDICATIONS:  1.  Amiodarone 400 mg t.i.d. for three weeks, then b.i.d. for two weeks,      then once daily as instructed.  2.  Lasix 20 mg daily.  3.  Metoprolol 25 mg b.i.d.  4.  Coumadin 5 mg daily.  5.  Zyrtec 10 mg daily.  6.  Lipitor as before.  7.  Baby aspirin 81 mg daily.  8.  Glucophage 500 mg b.i.d.  9.  Naproxen as needed.   The patient was instructed to refrain from any strenuous activity for the  next 48 hours.  He will slowly resume his normal activities as tolerated.  He will follow up with Dr. Reyes Ivan at [facility] in one week for  post hospital visit and PT/INR.  Patient will need a work-up for aortic  valve replacement.  His aortic valve is critically  stenotic with an aortic valve area of 0.7 sq cm.  We held off on  catheterization during this  hospitalization since his Coumadin/INR was  greater than 1.5.  He will follow up with Dr. Lendell Caprice at [facility]  in approximately one month.      Elmore Guise., M.D.  Electronically Signed     TWK/MEDQ  D:  12/16/2004  T:  12/16/2004  Job:  [id number]

## 2010-09-21 ENCOUNTER — Ambulatory Visit (INDEPENDENT_AMBULATORY_CARE_PROVIDER_SITE_OTHER): Payer: Medicare Other | Admitting: *Deleted

## 2010-09-21 DIAGNOSIS — I4891 Unspecified atrial fibrillation: Secondary | ICD-10-CM

## 2010-09-21 DIAGNOSIS — Z7901 Long term (current) use of anticoagulants: Secondary | ICD-10-CM

## 2010-09-21 LAB — POCT INR: INR: 3.5

## 2010-10-07 ENCOUNTER — Ambulatory Visit (INDEPENDENT_AMBULATORY_CARE_PROVIDER_SITE_OTHER): Payer: Medicare Other | Admitting: *Deleted

## 2010-10-07 DIAGNOSIS — I4891 Unspecified atrial fibrillation: Secondary | ICD-10-CM

## 2010-10-07 DIAGNOSIS — Z7901 Long term (current) use of anticoagulants: Secondary | ICD-10-CM

## 2010-10-07 LAB — POCT INR: INR: 2.1

## 2010-10-28 ENCOUNTER — Ambulatory Visit (INDEPENDENT_AMBULATORY_CARE_PROVIDER_SITE_OTHER): Payer: Medicare Other | Admitting: *Deleted

## 2010-10-28 DIAGNOSIS — Z7901 Long term (current) use of anticoagulants: Secondary | ICD-10-CM

## 2010-10-28 DIAGNOSIS — I4891 Unspecified atrial fibrillation: Secondary | ICD-10-CM

## 2010-10-28 LAB — POCT INR: INR: 2.4

## 2010-10-29 ENCOUNTER — Other Ambulatory Visit: Payer: Self-pay | Admitting: Internal Medicine

## 2010-10-29 ENCOUNTER — Ambulatory Visit (INDEPENDENT_AMBULATORY_CARE_PROVIDER_SITE_OTHER): Payer: Medicare Other | Admitting: *Deleted

## 2010-10-29 DIAGNOSIS — I4891 Unspecified atrial fibrillation: Secondary | ICD-10-CM

## 2010-10-29 DIAGNOSIS — I442 Atrioventricular block, complete: Secondary | ICD-10-CM

## 2010-11-01 LAB — REMOTE PACEMAKER DEVICE
AL IMPEDENCE PM: 440 Ohm
BATTERY VOLTAGE: 2.92 V
BMOD-0002RV: 7
BMOD-0002RV: 7
BMOD-0003RV: 30
BMOD-0003RV: 30
BMOD-0004RV: 5
RV LEAD IMPEDENCE PM: 512 Ohm
RV LEAD IMPEDENCE PM: 512 Ohm
VENTRICULAR PACING PM: 85.32

## 2010-11-03 NOTE — Progress Notes (Signed)
Pacer remote check  

## 2010-11-06 ENCOUNTER — Encounter: Payer: Self-pay | Admitting: *Deleted

## 2010-11-25 ENCOUNTER — Encounter: Payer: Self-pay | Admitting: Cardiology

## 2010-11-25 ENCOUNTER — Ambulatory Visit (INDEPENDENT_AMBULATORY_CARE_PROVIDER_SITE_OTHER): Payer: Medicare Other | Admitting: *Deleted

## 2010-11-25 DIAGNOSIS — Z7901 Long term (current) use of anticoagulants: Secondary | ICD-10-CM

## 2010-11-25 DIAGNOSIS — I4891 Unspecified atrial fibrillation: Secondary | ICD-10-CM

## 2010-12-23 ENCOUNTER — Ambulatory Visit (INDEPENDENT_AMBULATORY_CARE_PROVIDER_SITE_OTHER): Payer: Medicare Other | Admitting: *Deleted

## 2010-12-23 DIAGNOSIS — Z7901 Long term (current) use of anticoagulants: Secondary | ICD-10-CM

## 2010-12-23 DIAGNOSIS — I4891 Unspecified atrial fibrillation: Secondary | ICD-10-CM

## 2010-12-23 LAB — POCT INR: INR: 1.8

## 2011-01-20 ENCOUNTER — Encounter: Payer: Medicare Other | Admitting: *Deleted

## 2011-01-28 ENCOUNTER — Ambulatory Visit (INDEPENDENT_AMBULATORY_CARE_PROVIDER_SITE_OTHER): Payer: Medicare Other | Admitting: *Deleted

## 2011-01-28 DIAGNOSIS — I4891 Unspecified atrial fibrillation: Secondary | ICD-10-CM

## 2011-01-28 DIAGNOSIS — I442 Atrioventricular block, complete: Secondary | ICD-10-CM

## 2011-01-29 LAB — PACEMAKER DEVICE OBSERVATION
AL IMPEDENCE PM: 480 Ohm
BMOD-0002RV: 7
BRDY-0004RV: 130 {beats}/min
RV LEAD IMPEDENCE PM: 520 Ohm
RV LEAD THRESHOLD: 1 V
VENTRICULAR PACING PM: 69.29

## 2011-02-04 NOTE — Progress Notes (Signed)
Pacer remote check  

## 2011-02-18 ENCOUNTER — Encounter: Payer: Self-pay | Admitting: *Deleted

## 2011-02-23 ENCOUNTER — Telehealth: Payer: Self-pay | Admitting: Internal Medicine

## 2011-02-23 NOTE — Telephone Encounter (Signed)
LOV faxed to [facility], @ [phone]  02/23/11/km

## 2011-03-15 ENCOUNTER — Other Ambulatory Visit: Payer: Self-pay | Admitting: Cardiology

## 2011-03-16 ENCOUNTER — Ambulatory Visit (INDEPENDENT_AMBULATORY_CARE_PROVIDER_SITE_OTHER): Payer: Medicare Other | Admitting: *Deleted

## 2011-03-16 DIAGNOSIS — Z7901 Long term (current) use of anticoagulants: Secondary | ICD-10-CM

## 2011-03-16 DIAGNOSIS — I4891 Unspecified atrial fibrillation: Secondary | ICD-10-CM

## 2011-03-16 LAB — POCT INR: INR: 2.3

## 2011-03-16 MED ORDER — WARFARIN SODIUM 5 MG PO TABS: 5.0000 mg | ORAL_TABLET | Freq: Every day | ORAL | Status: DC

## 2011-03-23 ENCOUNTER — Ambulatory Visit: Payer: Medicare Other

## 2011-04-07 ENCOUNTER — Ambulatory Visit: Payer: Medicare Other | Attending: Pain Medicine

## 2011-04-07 DIAGNOSIS — M2550 Pain in unspecified joint: Secondary | ICD-10-CM | POA: Insufficient documentation

## 2011-04-07 DIAGNOSIS — M2569 Stiffness of other specified joint, not elsewhere classified: Secondary | ICD-10-CM | POA: Insufficient documentation

## 2011-04-07 DIAGNOSIS — IMO0001 Reserved for inherently not codable concepts without codable children: Secondary | ICD-10-CM | POA: Insufficient documentation

## 2011-04-07 DIAGNOSIS — R293 Abnormal posture: Secondary | ICD-10-CM | POA: Insufficient documentation

## 2011-04-13 ENCOUNTER — Ambulatory Visit (INDEPENDENT_AMBULATORY_CARE_PROVIDER_SITE_OTHER): Payer: Medicare Other | Admitting: *Deleted

## 2011-04-13 DIAGNOSIS — I4891 Unspecified atrial fibrillation: Secondary | ICD-10-CM

## 2011-04-13 DIAGNOSIS — Z7901 Long term (current) use of anticoagulants: Secondary | ICD-10-CM

## 2011-04-13 LAB — POCT INR: INR: 2.2

## 2011-04-14 ENCOUNTER — Ambulatory Visit: Payer: Medicare Other | Admitting: Rehabilitation

## 2011-04-20 ENCOUNTER — Ambulatory Visit: Payer: Medicare Other

## 2011-04-23 ENCOUNTER — Ambulatory Visit: Payer: Medicare Other

## 2011-04-29 ENCOUNTER — Encounter: Payer: Medicare Other | Admitting: *Deleted

## 2011-05-04 HISTORY — PX: OTHER SURGICAL HISTORY: SHX169

## 2011-05-05 ENCOUNTER — Encounter: Payer: Self-pay | Admitting: *Deleted

## 2011-05-10 ENCOUNTER — Other Ambulatory Visit: Payer: Self-pay | Admitting: *Deleted

## 2011-05-10 MED ORDER — WARFARIN SODIUM 5 MG PO TABS: 5.0000 mg | ORAL_TABLET | Freq: Every day | ORAL | Status: DC

## 2011-05-11 ENCOUNTER — Telehealth: Payer: Self-pay | Admitting: *Deleted

## 2011-05-11 ENCOUNTER — Ambulatory Visit (INDEPENDENT_AMBULATORY_CARE_PROVIDER_SITE_OTHER): Payer: Medicare Other | Admitting: *Deleted

## 2011-05-11 DIAGNOSIS — I4891 Unspecified atrial fibrillation: Secondary | ICD-10-CM | POA: Diagnosis not present

## 2011-05-11 DIAGNOSIS — Z7901 Long term (current) use of anticoagulants: Secondary | ICD-10-CM

## 2011-05-11 NOTE — Telephone Encounter (Signed)
Pt called to give new meds.

## 2011-05-12 DIAGNOSIS — T82898A Other specified complication of vascular prosthetic devices, implants and grafts, initial encounter: Secondary | ICD-10-CM | POA: Diagnosis present

## 2011-05-12 DIAGNOSIS — I4891 Unspecified atrial fibrillation: Secondary | ICD-10-CM | POA: Diagnosis not present

## 2011-05-12 DIAGNOSIS — I6529 Occlusion and stenosis of unspecified carotid artery: Secondary | ICD-10-CM | POA: Diagnosis present

## 2011-05-14 ENCOUNTER — Ambulatory Visit (INDEPENDENT_AMBULATORY_CARE_PROVIDER_SITE_OTHER): Payer: Medicare Other | Admitting: *Deleted

## 2011-05-14 DIAGNOSIS — Z7901 Long term (current) use of anticoagulants: Secondary | ICD-10-CM | POA: Diagnosis not present

## 2011-05-14 DIAGNOSIS — I4891 Unspecified atrial fibrillation: Secondary | ICD-10-CM

## 2011-05-14 LAB — POCT INR: INR: 2.4

## 2011-05-28 DIAGNOSIS — L259 Unspecified contact dermatitis, unspecified cause: Secondary | ICD-10-CM | POA: Diagnosis not present

## 2011-05-28 DIAGNOSIS — E119 Type 2 diabetes mellitus without complications: Secondary | ICD-10-CM | POA: Diagnosis not present

## 2011-05-28 DIAGNOSIS — L84 Corns and callosities: Secondary | ICD-10-CM | POA: Diagnosis not present

## 2011-05-28 DIAGNOSIS — L608 Other nail disorders: Secondary | ICD-10-CM | POA: Diagnosis not present

## 2011-05-31 DIAGNOSIS — H4010X0 Unspecified open-angle glaucoma, stage unspecified: Secondary | ICD-10-CM | POA: Diagnosis not present

## 2011-05-31 DIAGNOSIS — H2510 Age-related nuclear cataract, unspecified eye: Secondary | ICD-10-CM | POA: Diagnosis not present

## 2011-06-08 ENCOUNTER — Ambulatory Visit (INDEPENDENT_AMBULATORY_CARE_PROVIDER_SITE_OTHER): Payer: Medicare Other | Admitting: *Deleted

## 2011-06-08 DIAGNOSIS — Z7901 Long term (current) use of anticoagulants: Secondary | ICD-10-CM | POA: Diagnosis not present

## 2011-06-08 DIAGNOSIS — I4891 Unspecified atrial fibrillation: Secondary | ICD-10-CM | POA: Diagnosis not present

## 2011-06-08 LAB — POCT INR: INR: 2.2

## 2011-06-14 DIAGNOSIS — M19019 Primary osteoarthritis, unspecified shoulder: Secondary | ICD-10-CM | POA: Diagnosis not present

## 2011-06-14 DIAGNOSIS — G609 Hereditary and idiopathic neuropathy, unspecified: Secondary | ICD-10-CM | POA: Diagnosis not present

## 2011-06-14 DIAGNOSIS — G894 Chronic pain syndrome: Secondary | ICD-10-CM | POA: Diagnosis not present

## 2011-06-14 DIAGNOSIS — M1990 Unspecified osteoarthritis, unspecified site: Secondary | ICD-10-CM | POA: Diagnosis not present

## 2011-06-25 DIAGNOSIS — I6529 Occlusion and stenosis of unspecified carotid artery: Secondary | ICD-10-CM | POA: Diagnosis not present

## 2011-07-15 DIAGNOSIS — I2510 Atherosclerotic heart disease of native coronary artery without angina pectoris: Secondary | ICD-10-CM | POA: Diagnosis not present

## 2011-07-15 DIAGNOSIS — I10 Essential (primary) hypertension: Secondary | ICD-10-CM | POA: Diagnosis not present

## 2011-07-15 DIAGNOSIS — I4891 Unspecified atrial fibrillation: Secondary | ICD-10-CM | POA: Diagnosis not present

## 2011-07-15 DIAGNOSIS — I6529 Occlusion and stenosis of unspecified carotid artery: Secondary | ICD-10-CM | POA: Diagnosis not present

## 2011-07-15 DIAGNOSIS — E7800 Pure hypercholesterolemia, unspecified: Secondary | ICD-10-CM | POA: Diagnosis not present

## 2011-07-20 ENCOUNTER — Ambulatory Visit (INDEPENDENT_AMBULATORY_CARE_PROVIDER_SITE_OTHER): Payer: Medicare Other

## 2011-07-20 DIAGNOSIS — I4891 Unspecified atrial fibrillation: Secondary | ICD-10-CM

## 2011-07-20 DIAGNOSIS — Z7901 Long term (current) use of anticoagulants: Secondary | ICD-10-CM

## 2011-07-20 LAB — POCT INR: INR: 2.2

## 2011-08-05 DIAGNOSIS — L608 Other nail disorders: Secondary | ICD-10-CM | POA: Diagnosis not present

## 2011-08-05 DIAGNOSIS — E119 Type 2 diabetes mellitus without complications: Secondary | ICD-10-CM | POA: Diagnosis not present

## 2011-08-05 DIAGNOSIS — L84 Corns and callosities: Secondary | ICD-10-CM | POA: Diagnosis not present

## 2011-08-30 ENCOUNTER — Encounter: Payer: Self-pay | Admitting: Internal Medicine

## 2011-08-30 ENCOUNTER — Ambulatory Visit (INDEPENDENT_AMBULATORY_CARE_PROVIDER_SITE_OTHER): Payer: Medicare Other | Admitting: Internal Medicine

## 2011-08-30 VITALS — BP 142/78 | HR 75 | Resp 18 | Ht 72.0 in | Wt 231.8 lb

## 2011-08-30 DIAGNOSIS — I442 Atrioventricular block, complete: Secondary | ICD-10-CM

## 2011-08-30 DIAGNOSIS — I4891 Unspecified atrial fibrillation: Secondary | ICD-10-CM

## 2011-08-30 DIAGNOSIS — Z954 Presence of other heart-valve replacement: Secondary | ICD-10-CM

## 2011-08-30 DIAGNOSIS — Z952 Presence of prosthetic heart valve: Secondary | ICD-10-CM

## 2011-08-30 DIAGNOSIS — I10 Essential (primary) hypertension: Secondary | ICD-10-CM | POA: Diagnosis not present

## 2011-08-30 LAB — PACEMAKER DEVICE OBSERVATION
BMOD-0003RV: 30
BMOD-0004RV: 5
BRDY-0004RV: 130 {beats}/min
RV LEAD AMPLITUDE: 14.6 mv
RV LEAD IMPEDENCE PM: 496 Ohm
RV LEAD THRESHOLD: 1 V
VENTRICULAR PACING PM: 93.8

## 2011-08-30 NOTE — Assessment & Plan Note (Signed)
Doing well. Continue coumadin.  

## 2011-08-30 NOTE — Progress Notes (Signed)
PCP: Pearson Grippe, MD, MD  Jeffrey Whitney is a 78 y.o. male who presents today for routine electrophysiology followup.  Since last being seen in our clinic, the patient reports doing very well.  He remains very active, without difficulty.  Today, he denies symptoms of palpitations, chest pain, shortness of breath,  lower extremity edema, dizziness, presyncope, or syncope.  The patient is otherwise without complaint today.   Past Medical History  Diagnosis Date  . Complete heart block 05/24/2005    Pacemaker (MDT) implanted by Dr Reyes Ivan  . CAD (coronary artery disease)     s/p CABG  . PVD (peripheral vascular disease)     s/p remote bilateral femoral bypass at [facility]  . HTN (hypertension)   . DM (diabetes mellitus)   . Peripheral autonomic neuropathy due to DM   . Venous insufficiency   . S/P AVR (aortic valve replacement)     mechanical valve  . A-fib     Permanent afib  . Hyperlipidemia    Past Surgical History  Procedure Date  . Pacemaker insertion 05/24/2005    MDT by Dr Reyes Ivan  . Coronary artery bypass graft     at [facility]  . Carotid endarterectomy 06/25/10    at [facility]  . Femoral artery - femoral artery bypass graft     remote  . R carotid stent placement 1/13    at [facility]    Current Outpatient Prescriptions  Medication Sig Dispense Refill  . aspirin 81 MG tablet Take 81 mg by mouth daily.        Marland Kitchen atorvastatin (LIPITOR) 40 MG tablet Take 40 mg by mouth at bedtime.        . bimatoprost (LUMIGAN) 0.03 % ophthalmic drops Place 1 drop into both eyes at bedtime.        . folic acid (FOLVITE) 1 MG tablet Take 1 mg by mouth daily.        . furosemide (LASIX) 40 MG tablet Take 40 mg by mouth 2 (two) times daily.        Marland Kitchen gabapentin (NEURONTIN) 300 MG capsule Take 300 mg by mouth at bedtime.        . metFORMIN (GLUCOPHAGE) 500 MG tablet Take 1,000 mg by mouth 2 (two) times daily with a meal.       . metoprolol (LOPRESSOR) 50 MG tablet Take 25 mg by mouth 2 (two)  times daily.       . Multiple Vitamin (MULTIVITAMIN) tablet Take 1 tablet by mouth daily.        . Omega-3 Fatty Acids (FISH OIL) 1000 MG CAPS Take 2 capsules by mouth daily.        . pantoprazole (PROTONIX) 40 MG tablet       . timolol (TIMOPTIC) 0.5 % ophthalmic solution       . traMADol (ULTRAM) 50 MG tablet Take 50 mg by mouth every 6 (six) hours as needed.        . valsartan (DIOVAN) 160 MG tablet Take 160 mg by mouth daily.        . vitamin D, CHOLECALCIFEROL, 400 UNITS tablet Take 400 Units by mouth daily.        Marland Kitchen warfarin (COUMADIN) 5 MG tablet Take 1 tablet (5 mg total) by mouth daily. Take as directed by coumadin clinic  35 tablet  3    Physical Exam: Filed Vitals:   08/30/11 1118  BP: 142/78  Pulse: 75  Resp: 18  Height: 6' (  1.829 m)  Weight: 231 lb 12.8 oz (105.144 kg)  SpO2: 98%    GEN- The patient is well appearing, alert and oriented x 3 today.   Head- normocephalic, atraumatic Eyes-  Sclera clear, conjunctiva pink Ears- hearing intact Oropharynx- clear Lungs- Clear to ausculation bilaterally, normal work of breathing Chest- pacemaker pocket is well healed Heart- Regular rate and rhythm (paced) GI- soft, NT, ND, + BS Extremities- no clubbing, cyanosis, 1+ edema  Pacemaker interrogation- reviewed in detail today,  See PACEART report  Assessment and Plan:

## 2011-08-30 NOTE — Assessment & Plan Note (Signed)
Normal pacemaker function See Pace Art report No changes today  

## 2011-08-30 NOTE — Assessment & Plan Note (Signed)
Stable No change required today  

## 2011-08-30 NOTE — Assessment & Plan Note (Signed)
Permanent afib Continue coumadin long term  

## 2011-08-30 NOTE — Patient Instructions (Signed)
Remote monitoring is used to monitor your Pacemaker of ICD from home. This monitoring reduces the number of office visits required to check your device to one time per year. It allows [place] to keep an eye on the functioning of your device to ensure it is working properly. You are scheduled for a device check from home on December 02, 2011. You may send your transmission at any time that day. If you have a wireless device, the transmission will be sent automatically. After your physician reviews your transmission, you will receive a postcard with your next transmission date.  Your physician wants you to follow-up in: 1 year with Dr Johney Frame. You will receive a reminder letter in the mail two months in advance. If you don't receive a letter, please call our office to schedule the follow-up appointment.

## 2011-08-31 ENCOUNTER — Ambulatory Visit (INDEPENDENT_AMBULATORY_CARE_PROVIDER_SITE_OTHER): Payer: Medicare Other

## 2011-08-31 DIAGNOSIS — I4891 Unspecified atrial fibrillation: Secondary | ICD-10-CM | POA: Diagnosis not present

## 2011-08-31 DIAGNOSIS — Z7901 Long term (current) use of anticoagulants: Secondary | ICD-10-CM

## 2011-08-31 LAB — POCT INR: INR: 2.4

## 2011-09-02 DIAGNOSIS — H4011X0 Primary open-angle glaucoma, stage unspecified: Secondary | ICD-10-CM | POA: Diagnosis not present

## 2011-10-05 DIAGNOSIS — Z85828 Personal history of other malignant neoplasm of skin: Secondary | ICD-10-CM | POA: Diagnosis not present

## 2011-10-05 DIAGNOSIS — D239 Other benign neoplasm of skin, unspecified: Secondary | ICD-10-CM | POA: Diagnosis not present

## 2011-10-05 DIAGNOSIS — L570 Actinic keratosis: Secondary | ICD-10-CM | POA: Diagnosis not present

## 2011-10-05 DIAGNOSIS — L821 Other seborrheic keratosis: Secondary | ICD-10-CM | POA: Diagnosis not present

## 2011-10-12 ENCOUNTER — Ambulatory Visit (INDEPENDENT_AMBULATORY_CARE_PROVIDER_SITE_OTHER): Payer: Medicare Other

## 2011-10-12 DIAGNOSIS — I4891 Unspecified atrial fibrillation: Secondary | ICD-10-CM

## 2011-10-12 DIAGNOSIS — Z7901 Long term (current) use of anticoagulants: Secondary | ICD-10-CM | POA: Diagnosis not present

## 2011-10-21 ENCOUNTER — Other Ambulatory Visit: Payer: Self-pay | Admitting: *Deleted

## 2011-10-21 MED ORDER — WARFARIN SODIUM 5 MG PO TABS: ORAL_TABLET | ORAL | Status: DC

## 2011-10-26 DIAGNOSIS — L608 Other nail disorders: Secondary | ICD-10-CM | POA: Diagnosis not present

## 2011-10-26 DIAGNOSIS — E119 Type 2 diabetes mellitus without complications: Secondary | ICD-10-CM | POA: Diagnosis not present

## 2011-11-08 DIAGNOSIS — H2510 Age-related nuclear cataract, unspecified eye: Secondary | ICD-10-CM | POA: Diagnosis not present

## 2011-11-08 DIAGNOSIS — H409 Unspecified glaucoma: Secondary | ICD-10-CM | POA: Diagnosis not present

## 2011-11-08 DIAGNOSIS — H4011X0 Primary open-angle glaucoma, stage unspecified: Secondary | ICD-10-CM | POA: Diagnosis not present

## 2011-11-23 ENCOUNTER — Ambulatory Visit (INDEPENDENT_AMBULATORY_CARE_PROVIDER_SITE_OTHER): Payer: Medicare Other

## 2011-11-23 DIAGNOSIS — I4891 Unspecified atrial fibrillation: Secondary | ICD-10-CM

## 2011-11-23 DIAGNOSIS — Z7901 Long term (current) use of anticoagulants: Secondary | ICD-10-CM | POA: Diagnosis not present

## 2011-12-02 ENCOUNTER — Encounter: Payer: Medicare Other | Admitting: *Deleted

## 2011-12-06 DIAGNOSIS — G894 Chronic pain syndrome: Secondary | ICD-10-CM | POA: Diagnosis not present

## 2011-12-06 DIAGNOSIS — G609 Hereditary and idiopathic neuropathy, unspecified: Secondary | ICD-10-CM | POA: Diagnosis not present

## 2011-12-06 DIAGNOSIS — M1990 Unspecified osteoarthritis, unspecified site: Secondary | ICD-10-CM | POA: Diagnosis not present

## 2011-12-06 DIAGNOSIS — M1710 Unilateral primary osteoarthritis, unspecified knee: Secondary | ICD-10-CM | POA: Diagnosis not present

## 2011-12-08 ENCOUNTER — Encounter: Payer: Self-pay | Admitting: *Deleted

## 2011-12-14 ENCOUNTER — Ambulatory Visit (INDEPENDENT_AMBULATORY_CARE_PROVIDER_SITE_OTHER): Payer: Medicare Other | Admitting: Pharmacist

## 2011-12-14 DIAGNOSIS — Z7901 Long term (current) use of anticoagulants: Secondary | ICD-10-CM

## 2011-12-14 DIAGNOSIS — I4891 Unspecified atrial fibrillation: Secondary | ICD-10-CM | POA: Diagnosis not present

## 2011-12-17 DIAGNOSIS — I6529 Occlusion and stenosis of unspecified carotid artery: Secondary | ICD-10-CM | POA: Diagnosis not present

## 2011-12-20 ENCOUNTER — Ambulatory Visit (INDEPENDENT_AMBULATORY_CARE_PROVIDER_SITE_OTHER): Payer: Medicare Other | Admitting: *Deleted

## 2011-12-20 ENCOUNTER — Encounter: Payer: Self-pay | Admitting: Internal Medicine

## 2011-12-20 DIAGNOSIS — I442 Atrioventricular block, complete: Secondary | ICD-10-CM | POA: Diagnosis not present

## 2011-12-24 LAB — REMOTE PACEMAKER DEVICE
BMOD-0002RV: 7
BMOD-0003RV: 30

## 2011-12-28 DIAGNOSIS — L84 Corns and callosities: Secondary | ICD-10-CM | POA: Diagnosis not present

## 2011-12-28 DIAGNOSIS — L608 Other nail disorders: Secondary | ICD-10-CM | POA: Diagnosis not present

## 2011-12-28 DIAGNOSIS — E119 Type 2 diabetes mellitus without complications: Secondary | ICD-10-CM | POA: Diagnosis not present

## 2012-01-04 ENCOUNTER — Ambulatory Visit (INDEPENDENT_AMBULATORY_CARE_PROVIDER_SITE_OTHER): Payer: Medicare Other | Admitting: *Deleted

## 2012-01-04 ENCOUNTER — Encounter: Payer: Self-pay | Admitting: *Deleted

## 2012-01-04 DIAGNOSIS — Z7901 Long term (current) use of anticoagulants: Secondary | ICD-10-CM

## 2012-01-04 DIAGNOSIS — I4891 Unspecified atrial fibrillation: Secondary | ICD-10-CM | POA: Diagnosis not present

## 2012-01-04 LAB — POCT INR: INR: 1.9

## 2012-01-24 DIAGNOSIS — Z23 Encounter for immunization: Secondary | ICD-10-CM | POA: Diagnosis not present

## 2012-01-25 ENCOUNTER — Ambulatory Visit (INDEPENDENT_AMBULATORY_CARE_PROVIDER_SITE_OTHER): Payer: Medicare Other | Admitting: Pharmacist

## 2012-01-25 DIAGNOSIS — Z7901 Long term (current) use of anticoagulants: Secondary | ICD-10-CM

## 2012-01-25 DIAGNOSIS — I4891 Unspecified atrial fibrillation: Secondary | ICD-10-CM

## 2012-01-25 LAB — POCT INR: INR: 2

## 2012-02-22 ENCOUNTER — Ambulatory Visit (INDEPENDENT_AMBULATORY_CARE_PROVIDER_SITE_OTHER): Payer: Medicare Other | Admitting: *Deleted

## 2012-02-22 DIAGNOSIS — Z7901 Long term (current) use of anticoagulants: Secondary | ICD-10-CM | POA: Diagnosis not present

## 2012-02-22 DIAGNOSIS — I4891 Unspecified atrial fibrillation: Secondary | ICD-10-CM

## 2012-02-29 DIAGNOSIS — L84 Corns and callosities: Secondary | ICD-10-CM | POA: Diagnosis not present

## 2012-02-29 DIAGNOSIS — L608 Other nail disorders: Secondary | ICD-10-CM | POA: Diagnosis not present

## 2012-02-29 DIAGNOSIS — M2040 Other hammer toe(s) (acquired), unspecified foot: Secondary | ICD-10-CM | POA: Diagnosis not present

## 2012-02-29 DIAGNOSIS — E119 Type 2 diabetes mellitus without complications: Secondary | ICD-10-CM | POA: Diagnosis not present

## 2012-03-08 DIAGNOSIS — H409 Unspecified glaucoma: Secondary | ICD-10-CM | POA: Diagnosis not present

## 2012-03-08 DIAGNOSIS — H259 Unspecified age-related cataract: Secondary | ICD-10-CM | POA: Diagnosis not present

## 2012-03-08 DIAGNOSIS — H4011X0 Primary open-angle glaucoma, stage unspecified: Secondary | ICD-10-CM | POA: Diagnosis not present

## 2012-03-21 ENCOUNTER — Ambulatory Visit (INDEPENDENT_AMBULATORY_CARE_PROVIDER_SITE_OTHER): Payer: Medicare Other | Admitting: *Deleted

## 2012-03-21 DIAGNOSIS — Z7901 Long term (current) use of anticoagulants: Secondary | ICD-10-CM

## 2012-03-21 DIAGNOSIS — I4891 Unspecified atrial fibrillation: Secondary | ICD-10-CM

## 2012-03-27 ENCOUNTER — Ambulatory Visit (INDEPENDENT_AMBULATORY_CARE_PROVIDER_SITE_OTHER): Payer: Medicare Other | Admitting: *Deleted

## 2012-03-27 ENCOUNTER — Encounter: Payer: Self-pay | Admitting: Internal Medicine

## 2012-03-27 DIAGNOSIS — I442 Atrioventricular block, complete: Secondary | ICD-10-CM

## 2012-03-28 ENCOUNTER — Telehealth: Payer: Self-pay | Admitting: Internal Medicine

## 2012-03-28 NOTE — Telephone Encounter (Signed)
Anette- Dr. Lequita Asal Dentist  [phone]  plz return call to [facility] regarding Antibiotics before dentist appnt for pt.

## 2012-03-28 NOTE — Telephone Encounter (Signed)
Patient will need SBE.  Office aware and will handle

## 2012-03-29 LAB — REMOTE PACEMAKER DEVICE
AL IMPEDENCE PM: 432 Ohm
RV LEAD IMPEDENCE PM: 504 Ohm
VENTRICULAR PACING PM: 93.2

## 2012-04-13 ENCOUNTER — Ambulatory Visit (INDEPENDENT_AMBULATORY_CARE_PROVIDER_SITE_OTHER): Payer: Medicare Other | Admitting: Internal Medicine

## 2012-04-13 ENCOUNTER — Encounter: Payer: Self-pay | Admitting: Internal Medicine

## 2012-04-13 VITALS — BP 162/85 | HR 64 | Ht 72.0 in | Wt 227.4 lb

## 2012-04-13 DIAGNOSIS — I442 Atrioventricular block, complete: Secondary | ICD-10-CM

## 2012-04-13 DIAGNOSIS — Z950 Presence of cardiac pacemaker: Secondary | ICD-10-CM | POA: Diagnosis not present

## 2012-04-13 DIAGNOSIS — Z954 Presence of other heart-valve replacement: Secondary | ICD-10-CM

## 2012-04-13 DIAGNOSIS — I4891 Unspecified atrial fibrillation: Secondary | ICD-10-CM | POA: Diagnosis not present

## 2012-04-13 DIAGNOSIS — I10 Essential (primary) hypertension: Secondary | ICD-10-CM

## 2012-04-13 DIAGNOSIS — Z952 Presence of prosthetic heart valve: Secondary | ICD-10-CM

## 2012-04-13 NOTE — Progress Notes (Signed)
PCP: Pearson Grippe, MD  Jeffrey Whitney is a 78 y.o. male who presents today for routine electrophysiology followup.  Since last being seen in our clinic, the patient reports doing very well.  He remains very active, without difficulty.  He recently reached ERI battery status and presents today for further evaluation. Today, he denies symptoms of palpitations, chest pain, shortness of breath,  lower extremity edema, dizziness, presyncope, or syncope.  The patient is otherwise without complaint today.   Past Medical History  Diagnosis Date  . Complete heart block 05/24/2005    Pacemaker (MDT) implanted by Dr Reyes Ivan  . CAD (coronary artery disease)     s/p CABG  . PVD (peripheral vascular disease)     s/p remote bilateral femoral bypass at [facility]  . HTN (hypertension)   . DM (diabetes mellitus)   . Peripheral autonomic neuropathy due to DM   . Venous insufficiency   . S/P AVR (aortic valve replacement)     mechanical valve  . A-fib     Permanent afib  . Hyperlipidemia    Past Surgical History  Procedure Date  . Pacemaker insertion 05/24/2005    MDT by Dr Reyes Ivan  . Coronary artery bypass graft     at [facility]  . Carotid endarterectomy 06/25/10    at [facility]  . Femoral artery - femoral artery bypass graft     remote  . R carotid stent placement 1/13    at [facility]    Current Outpatient Prescriptions  Medication Sig Dispense Refill  . aspirin 81 MG tablet Take 81 mg by mouth daily.        Marland Kitchen atorvastatin (LIPITOR) 40 MG tablet Take 40 mg by mouth at bedtime.        . bimatoprost (LUMIGAN) 0.03 % ophthalmic drops Place 1 drop into both eyes at bedtime.        . folic acid (FOLVITE) 1 MG tablet Take 1 mg by mouth daily.        . furosemide (LASIX) 40 MG tablet Take 40 mg by mouth 2 (two) times daily.        Marland Kitchen gabapentin (NEURONTIN) 300 MG capsule Take 300 mg by mouth at bedtime.        . metFORMIN (GLUCOPHAGE) 500 MG tablet Take 1,000 mg by mouth 2 (two) times daily with a  meal.       . metoprolol (LOPRESSOR) 50 MG tablet Take 25 mg by mouth 2 (two) times daily.       . Multiple Vitamin (MULTIVITAMIN) tablet Take 1 tablet by mouth daily.        . Omega-3 Fatty Acids (FISH OIL) 1000 MG CAPS Take 2 capsules by mouth daily.        . pantoprazole (PROTONIX) 40 MG tablet       . timolol (TIMOPTIC) 0.5 % ophthalmic solution       . traMADol (ULTRAM) 50 MG tablet Take 50 mg by mouth every 6 (six) hours as needed.        . valsartan (DIOVAN) 160 MG tablet Take 160 mg by mouth daily.        . vitamin D, CHOLECALCIFEROL, 400 UNITS tablet Take 400 Units by mouth daily.        Marland Kitchen warfarin (COUMADIN) 5 MG tablet Take as directed by coumadin clinic  35 tablet  3    Physical Exam: Filed Vitals:   04/13/12 1225  BP: 162/85  Pulse: 64  Height: 6' (  1.829 m)  Weight: 227 lb 6.4 oz (103.148 kg)    GEN- The patient is well appearing, alert and oriented x 3 today.   Head- normocephalic, atraumatic Eyes-  Sclera clear, conjunctiva pink Ears- hearing intact Oropharynx- clear Lungs- Clear to ausculation bilaterally, normal work of breathing Chest- pacemaker pocket is well healed Heart- Regular rate and rhythm (paced) GI- soft, NT, ND, + BS Extremities- no clubbing, cyanosis, 1+ edema  Pacemaker interrogation- reviewed in detail today,  See PACEART report  Assessment and Plan:  Atrioventricular block, complete  He has reached ERI battery status.  He is device dependant.  He is programmed back to VVIR today. See Arita Miss Art report  Risks, benefits, and alternatives to PPM pulse generator replacement were discussed at length with the patient today who understands and wishes to proceed.  We will schedule generator change at the next available time. 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  We will treat with mupirocin and Hibiclens body wash in preparation for his generator change.  Atrial fibrillation  Permanent afib  Continue coumadin long term  Essential hypertension, benign  Stable   No change required today   S/P AVR (aortic valve replacement)  Doing well  Continue coumadin Will need an INR check on the day prior to his generator change.

## 2012-04-13 NOTE — Patient Instructions (Addendum)
Your physician recommends that you return for lab work at the hospital for MRSA   I will call you to schedule your generator change out after lab results come back

## 2012-04-14 LAB — PACEMAKER DEVICE OBSERVATION
BRDY-0002RV: 65 {beats}/min
RV LEAD THRESHOLD: 1 V

## 2012-04-18 ENCOUNTER — Ambulatory Visit (INDEPENDENT_AMBULATORY_CARE_PROVIDER_SITE_OTHER): Payer: Medicare Other | Admitting: *Deleted

## 2012-04-18 ENCOUNTER — Other Ambulatory Visit: Payer: Self-pay | Admitting: *Deleted

## 2012-04-18 ENCOUNTER — Encounter: Payer: Self-pay | Admitting: *Deleted

## 2012-04-18 DIAGNOSIS — I4891 Unspecified atrial fibrillation: Secondary | ICD-10-CM

## 2012-04-18 DIAGNOSIS — Z7901 Long term (current) use of anticoagulants: Secondary | ICD-10-CM

## 2012-04-18 MED ORDER — MUPIROCIN CALCIUM 2 % NA OINT: TOPICAL_OINTMENT | Freq: Two times a day (BID) | NASAL | Status: DC

## 2012-04-21 DIAGNOSIS — I4891 Unspecified atrial fibrillation: Secondary | ICD-10-CM | POA: Diagnosis not present

## 2012-04-21 DIAGNOSIS — E119 Type 2 diabetes mellitus without complications: Secondary | ICD-10-CM | POA: Diagnosis not present

## 2012-04-28 DIAGNOSIS — I4891 Unspecified atrial fibrillation: Secondary | ICD-10-CM | POA: Diagnosis not present

## 2012-04-28 DIAGNOSIS — G589 Mononeuropathy, unspecified: Secondary | ICD-10-CM | POA: Diagnosis not present

## 2012-04-28 DIAGNOSIS — I10 Essential (primary) hypertension: Secondary | ICD-10-CM | POA: Diagnosis not present

## 2012-04-28 DIAGNOSIS — Z23 Encounter for immunization: Secondary | ICD-10-CM | POA: Diagnosis not present

## 2012-04-28 DIAGNOSIS — E119 Type 2 diabetes mellitus without complications: Secondary | ICD-10-CM | POA: Diagnosis not present

## 2012-05-05 ENCOUNTER — Encounter (HOSPITAL_COMMUNITY): Payer: Self-pay | Admitting: Pharmacy Technician

## 2012-05-05 DIAGNOSIS — E119 Type 2 diabetes mellitus without complications: Secondary | ICD-10-CM | POA: Diagnosis not present

## 2012-05-05 DIAGNOSIS — L608 Other nail disorders: Secondary | ICD-10-CM | POA: Diagnosis not present

## 2012-05-05 DIAGNOSIS — L84 Corns and callosities: Secondary | ICD-10-CM | POA: Diagnosis not present

## 2012-05-17 ENCOUNTER — Telehealth: Payer: Self-pay | Admitting: Internal Medicine

## 2012-05-17 ENCOUNTER — Ambulatory Visit (INDEPENDENT_AMBULATORY_CARE_PROVIDER_SITE_OTHER): Payer: Medicare Other | Admitting: *Deleted

## 2012-05-17 ENCOUNTER — Other Ambulatory Visit (INDEPENDENT_AMBULATORY_CARE_PROVIDER_SITE_OTHER): Payer: Medicare Other

## 2012-05-17 ENCOUNTER — Telehealth: Payer: Self-pay

## 2012-05-17 DIAGNOSIS — I442 Atrioventricular block, complete: Secondary | ICD-10-CM | POA: Diagnosis not present

## 2012-05-17 DIAGNOSIS — E785 Hyperlipidemia, unspecified: Secondary | ICD-10-CM | POA: Diagnosis not present

## 2012-05-17 DIAGNOSIS — E1142 Type 2 diabetes mellitus with diabetic polyneuropathy: Secondary | ICD-10-CM | POA: Diagnosis not present

## 2012-05-17 DIAGNOSIS — I4891 Unspecified atrial fibrillation: Secondary | ICD-10-CM | POA: Diagnosis not present

## 2012-05-17 DIAGNOSIS — Z7901 Long term (current) use of anticoagulants: Secondary | ICD-10-CM | POA: Diagnosis not present

## 2012-05-17 DIAGNOSIS — E1149 Type 2 diabetes mellitus with other diabetic neurological complication: Secondary | ICD-10-CM | POA: Diagnosis not present

## 2012-05-17 DIAGNOSIS — Z79899 Other long term (current) drug therapy: Secondary | ICD-10-CM | POA: Diagnosis not present

## 2012-05-17 DIAGNOSIS — Z7982 Long term (current) use of aspirin: Secondary | ICD-10-CM | POA: Diagnosis not present

## 2012-05-17 DIAGNOSIS — I10 Essential (primary) hypertension: Secondary | ICD-10-CM | POA: Diagnosis not present

## 2012-05-17 DIAGNOSIS — Z45018 Encounter for adjustment and management of other part of cardiac pacemaker: Secondary | ICD-10-CM | POA: Diagnosis not present

## 2012-05-17 LAB — CBC WITH DIFFERENTIAL/PLATELET
Basophils Absolute: 0 10*3/uL (ref 0.0–0.1)
Basophils Relative: 0.3 % (ref 0.0–3.0)
Eosinophils Absolute: 0.8 10*3/uL — ABNORMAL HIGH (ref 0.0–0.7)
Lymphocytes Relative: 21.8 % (ref 12.0–46.0)
MCHC: 33.7 g/dL (ref 30.0–36.0)
MCV: 92.9 fl (ref 78.0–100.0)
Monocytes Absolute: 1 10*3/uL (ref 0.1–1.0)
Neutrophils Relative %: 59.4 % (ref 43.0–77.0)
Platelets: 153 10*3/uL (ref 150.0–400.0)
RBC: 4.81 Mil/uL (ref 4.22–5.81)
RDW: 13.8 % (ref 11.5–14.6)

## 2012-05-17 LAB — PROTIME-INR
INR: 2.6 ratio — ABNORMAL HIGH (ref 0.8–1.0)
Prothrombin Time: 26.8 s — ABNORMAL HIGH (ref 10.2–12.4)

## 2012-05-17 LAB — BASIC METABOLIC PANEL
BUN: 14 mg/dL (ref 6–23)
CO2: 32 mEq/L (ref 19–32)
Calcium: 9.6 mg/dL (ref 8.4–10.5)
Chloride: 106 mEq/L (ref 96–112)
Creatinine, Ser: 0.8 mg/dL (ref 0.4–1.5)
Glucose, Bld: 101 mg/dL — ABNORMAL HIGH (ref 70–99)

## 2012-05-17 LAB — POCT INR: INR: 2.7

## 2012-05-17 MED ORDER — CEFAZOLIN SODIUM-DEXTROSE 2-3 GM-% IV SOLR
2.0000 g | INTRAVENOUS | Status: AC
  Filled 2012-05-17: qty 50

## 2012-05-17 MED ORDER — SODIUM CHLORIDE 0.9 % IR SOLN
80.0000 mg | Status: AC
  Filled 2012-05-17 (×3): qty 2

## 2012-05-17 NOTE — Telephone Encounter (Signed)
Told patient to hold Coumadin tonight as his INR today was 1.7 and will restart tomorrow  He will also hold his Furosemide and his Metformin

## 2012-05-17 NOTE — Telephone Encounter (Signed)
A user error has taken place: encounter opened in error, closed for administrative reasons.

## 2012-05-17 NOTE — Telephone Encounter (Signed)
New problem:    Has questions regarding procedure on tomorrow.

## 2012-05-18 ENCOUNTER — Ambulatory Visit (HOSPITAL_COMMUNITY): Payer: Medicare Other

## 2012-05-18 ENCOUNTER — Ambulatory Visit (HOSPITAL_COMMUNITY)
Admission: RE | Admit: 2012-05-18 | Discharge: 2012-05-18 | Disposition: A | Discharge status: Home / Self Care | Payer: Medicare Other | Source: Ambulatory Visit | Attending: Internal Medicine | Admitting: Internal Medicine

## 2012-05-18 ENCOUNTER — Encounter (HOSPITAL_COMMUNITY)
Admission: RE | Disposition: A | Discharge status: Home / Self Care | Payer: Self-pay | Source: Ambulatory Visit | Attending: Internal Medicine

## 2012-05-18 DIAGNOSIS — I4891 Unspecified atrial fibrillation: Secondary | ICD-10-CM | POA: Insufficient documentation

## 2012-05-18 DIAGNOSIS — E1142 Type 2 diabetes mellitus with diabetic polyneuropathy: Secondary | ICD-10-CM | POA: Diagnosis not present

## 2012-05-18 DIAGNOSIS — I10 Essential (primary) hypertension: Secondary | ICD-10-CM | POA: Insufficient documentation

## 2012-05-18 DIAGNOSIS — Z45018 Encounter for adjustment and management of other part of cardiac pacemaker: Secondary | ICD-10-CM | POA: Insufficient documentation

## 2012-05-18 DIAGNOSIS — Z7901 Long term (current) use of anticoagulants: Secondary | ICD-10-CM | POA: Insufficient documentation

## 2012-05-18 DIAGNOSIS — I442 Atrioventricular block, complete: Secondary | ICD-10-CM | POA: Insufficient documentation

## 2012-05-18 DIAGNOSIS — Z7982 Long term (current) use of aspirin: Secondary | ICD-10-CM | POA: Insufficient documentation

## 2012-05-18 DIAGNOSIS — E1149 Type 2 diabetes mellitus with other diabetic neurological complication: Secondary | ICD-10-CM | POA: Diagnosis not present

## 2012-05-18 DIAGNOSIS — E785 Hyperlipidemia, unspecified: Secondary | ICD-10-CM | POA: Insufficient documentation

## 2012-05-18 DIAGNOSIS — Z79899 Other long term (current) drug therapy: Secondary | ICD-10-CM | POA: Insufficient documentation

## 2012-05-18 HISTORY — PX: PACEMAKER GENERATOR CHANGE: SHX5998

## 2012-05-18 HISTORY — PX: PACEMAKER GENERATOR CHANGE: SHX5481

## 2012-05-18 LAB — GLUCOSE, CAPILLARY: Glucose-Capillary: 117 mg/dL — ABNORMAL HIGH (ref 70–99)

## 2012-05-18 LAB — SURGICAL PCR SCREEN: Staphylococcus aureus: POSITIVE — AB

## 2012-05-18 LAB — PROTIME-INR: Prothrombin Time: 19.7 seconds — ABNORMAL HIGH (ref 11.6–15.2)

## 2012-05-18 SURGERY — PACEMAKER GENERATOR CHANGE
Anesthesia: LOCAL

## 2012-05-18 MED ORDER — SODIUM CHLORIDE 0.9 % IJ SOLN: 3.0000 mL | Freq: Two times a day (BID) | INTRAMUSCULAR | Status: DC

## 2012-05-18 MED ORDER — MUPIROCIN 2 % EX OINT
TOPICAL_OINTMENT | Freq: Two times a day (BID) | CUTANEOUS | Status: DC
  Administered 2012-05-18: 1 via NASAL
  Filled 2012-05-18: qty 22

## 2012-05-18 MED ORDER — MUPIROCIN 2 % EX OINT
TOPICAL_OINTMENT | CUTANEOUS | Status: AC
  Administered 2012-05-18: 1 via NASAL
  Filled 2012-05-18: qty 22

## 2012-05-18 MED ORDER — ONDANSETRON HCL 4 MG/2ML IJ SOLN: 4.0000 mg | Freq: Four times a day (QID) | INTRAMUSCULAR | Status: DC | PRN

## 2012-05-18 MED ORDER — ACETAMINOPHEN 325 MG PO TABS: 325.0000 mg | ORAL_TABLET | ORAL | Status: DC | PRN

## 2012-05-18 MED ORDER — HEPARIN (PORCINE) IN NACL 2-0.9 UNIT/ML-% IJ SOLN
INTRAMUSCULAR | Status: AC
  Filled 2012-05-18: qty 500

## 2012-05-18 MED ORDER — HYDROCODONE-ACETAMINOPHEN 5-325 MG PO TABS: 1.0000 | ORAL_TABLET | ORAL | Status: DC | PRN

## 2012-05-18 MED ORDER — SODIUM CHLORIDE 0.45 % IV SOLN
INTRAVENOUS | Status: DC
  Administered 2012-05-18: 14:00:00 via INTRAVENOUS

## 2012-05-18 MED ORDER — SODIUM CHLORIDE 0.9 % IV SOLN: 250.0000 mL | INTRAVENOUS | Status: DC

## 2012-05-18 MED ORDER — SODIUM CHLORIDE 0.9 % IJ SOLN: 3.0000 mL | INTRAMUSCULAR | Status: DC | PRN

## 2012-05-18 MED ORDER — LIDOCAINE HCL (PF) 1 % IJ SOLN
INTRAMUSCULAR | Status: AC
  Filled 2012-05-18: qty 60

## 2012-05-18 MED ORDER — CEFAZOLIN SODIUM-DEXTROSE 2-3 GM-% IV SOLR
INTRAVENOUS | Status: AC
  Filled 2012-05-18: qty 50

## 2012-05-18 NOTE — Op Note (Signed)
SURGEON:  Hillis Range, MD     PREPROCEDURE DIAGNOSES:   1. Permanent Atrial fibrillation.   2. Complete heart block  3. Pacemaker at [facility] battery status    POSTPROCEDURE DIAGNOSES:   1. Permanent Atrial fibrillation.   2. Complete heart block  3. Pacemaker at [facility] battery status    PROCEDURES:   1. Pacemaker pulse generator replacement.   2. Skin pocket revision.     INTRODUCTION:  Jeffrey Whitney is a 79 y.o. male with a history of permanent atrial fibrillation and complete heart block. He has done well since his pacemaker was implanted.  He has recently reached ERI battery status.  He presents today for pacemaker pulse generator replacement.       DESCRIPTION OF THE PROCEDURE:  Informed written consent was obtained, and the patient was brought to the electrophysiology lab in the fasting state.  The patient's pacemaker was interrogated today and found to be at elective replacement indicator battery status.  The patient required no sedation for the procedure today.  The patient's left chest was prepped and draped in the usual sterile fashion by the EP lab staff.  The skin overlying the existing pacemaker was infiltrated with lidocaine for local analgesia.  A 4-cm incision was made over the pacemaker pocket.  Using a combination of sharp and blunt dissection, the pacemaker was exposed and removed from the body.  The device was disconnected from the leads. A single silk suture was identified and removed which had secured the device to the pectoralis fascia.  There was no foreign matter or debris within the pocket.  The atrial lead was confirmed to be a Medtronic model [id number] (serial number [id number]) lead implanted on 05/24/2005.  The right ventricular lead was confirmed to be a Medtronic model [id number] (serial number [id number]) lead implanted on the same date as the atrial lead (above).  Both leads were examined and their integrity was confirmed to be intact. Due to permanent atrial fibrillation, the  atrial lead was capped off and returned to the pocket.   Right ventricular lead R-waves were absent today.  The RV lead impedance was 545 ohms with a threshold of 0.5 V at 0.5 msec.  The RV lead was connected to a Medtronic [place] model [id number] (serial number [id number]) pacemaker.  The pocket was revised to accommodate this new device.  Electrocautery was required to assure hemostasis.  The pocket was irrigated with copious gentamicin solution. The pacemaker was then placed into the pocket.  The pocket was then closed in 2 layers with 2-0 Vicryl suture over the subcutaneous and subcuticular layers.  Steri-Strips and a sterile dressing were then applied.  There were no early apparent complications.     CONCLUSIONS:   1. Successful pacemaker pulse generator replacement for elective replacement indicator battery status   2. No early apparent complications.     Hillis Range, MD 05/18/2012 3:09 PM

## 2012-05-18 NOTE — Progress Notes (Signed)
Pt positive for staph and MRSA. Mupiricin givin to pt with verbal and written instructions to complete treatment. Pt and family  Verbalize understanding.

## 2012-05-18 NOTE — H&P (Signed)
PCP: Pearson Grippe, MD  Jeffrey Whitney is a 79 y.o. male who presents today for pacemaker generator change. Since last being seen in our clinic, the patient reports doing very well. He remains very active, without difficulty. He recently reached ERI battery status and presents today for further evaluation. Today, he denies symptoms of palpitations, chest pain, shortness of breath, lower extremity edema, dizziness, presyncope, or syncope. The patient is otherwise without complaint today.   Past Medical History   Diagnosis  Date   .  Complete heart block  05/24/2005     Pacemaker (MDT) implanted by Dr Reyes Ivan   .  CAD (coronary artery disease)      s/p CABG   .  PVD (peripheral vascular disease)      s/p remote bilateral femoral bypass at [facility], [facility] at [facility]   .  HTN (hypertension)    .  DM (diabetes mellitus)    .  Peripheral autonomic neuropathy due to DM    .  Venous insufficiency    .  S/P AVR (aortic valve replacement)      mechanical valve   .  A-fib      Permanent afib   .  Hyperlipidemia     Past Surgical History   Procedure  Date   .  Pacemaker insertion  05/24/2005     MDT by Dr Reyes Ivan   .  Coronary artery bypass graft      at [facility]   .  Carotid endarterectomy  06/25/10     at [facility]   .  Femoral artery - femoral artery bypass graft      remote   .  R carotid stent placement  1/13     at [facility]    Current Outpatient Prescriptions   Medication  Sig  Dispense  Refill   .  aspirin 81 MG tablet  Take 81 mg by mouth daily.     Marland Kitchen  atorvastatin (LIPITOR) 40 MG tablet  Take 40 mg by mouth at bedtime.     .  bimatoprost (LUMIGAN) 0.03 % ophthalmic drops  Place 1 drop into both eyes at bedtime.     .  folic acid (FOLVITE) 1 MG tablet  Take 1 mg by mouth daily.     .  furosemide (LASIX) 40 MG tablet  Take 40 mg by mouth 2 (two) times daily.     Marland Kitchen  gabapentin (NEURONTIN) 300 MG capsule  Take 300 mg by mouth at bedtime.     .  metFORMIN (GLUCOPHAGE) 500 MG tablet  Take 1,000 mg by  mouth 2 (two) times daily with a meal.     .  metoprolol (LOPRESSOR) 50 MG tablet  Take 25 mg by mouth 2 (two) times daily.     .  Multiple Vitamin (MULTIVITAMIN) tablet  Take 1 tablet by mouth daily.     .  Omega-3 Fatty Acids (FISH OIL) 1000 MG CAPS  Take 2 capsules by mouth daily.     .  pantoprazole (PROTONIX) 40 MG tablet      .  timolol (TIMOPTIC) 0.5 % ophthalmic solution      .  traMADol (ULTRAM) 50 MG tablet  Take 50 mg by mouth every 6 (six) hours as needed.     .  valsartan (DIOVAN) 160 MG tablet  Take 160 mg by mouth daily.     .  vitamin D, CHOLECALCIFEROL, 400 UNITS tablet  Take 400 Units by mouth daily.     Marland Kitchen  warfarin (COUMADIN) 5 MG tablet  Take as directed by coumadin clinic  35 tablet  3    Physical Exam:  Filed Vitals:    04/13/12 1225   BP:  162/85   Pulse:  64   Height:  6' (1.829 m)   Weight:  227 lb 6.4 oz (103.148 kg)    GEN- The patient is well appearing, alert and oriented x 3 today.  Head- normocephalic, atraumatic  Eyes- Sclera clear, conjunctiva pink  Ears- hearing intact  Oropharynx- clear  Lungs- Clear to ausculation bilaterally, normal work of breathing  Chest- pacemaker pocket is well healed  Heart- Regular rate and rhythm (paced)  GI- soft, NT, ND, + BS  Extremities- no clubbing, cyanosis, 1+ edema  Pacemaker interrogation- reviewed in detail today, See PACEART report  Assessment and Plan:   Atrioventricular block, complete  He has reached ERI battery status. He is device dependant. Risks, benefits, and alternatives to PPM pulse generator replacement were discussed at length with the patient today who understands and wishes to proceed.  Atrial fibrillation  Permanent afib  Continue coumadin long term   Jarold Song

## 2012-05-29 ENCOUNTER — Ambulatory Visit (INDEPENDENT_AMBULATORY_CARE_PROVIDER_SITE_OTHER): Payer: Medicare Other | Admitting: *Deleted

## 2012-05-29 DIAGNOSIS — I442 Atrioventricular block, complete: Secondary | ICD-10-CM

## 2012-05-29 LAB — PACEMAKER DEVICE OBSERVATION
BATTERY VOLTAGE: 2.8 V
BMOD-0003RV: 30
BMOD-0005RV: 95 {beats}/min
BRDY-0002RV: 70 {beats}/min
BRDY-0004RV: 120 {beats}/min
RV LEAD IMPEDENCE PM: 586 Ohm
RV LEAD THRESHOLD: 0.75 V
VENTRICULAR PACING PM: 96

## 2012-05-29 NOTE — Progress Notes (Signed)
Wound check-PPM 

## 2012-06-14 ENCOUNTER — Ambulatory Visit (INDEPENDENT_AMBULATORY_CARE_PROVIDER_SITE_OTHER): Payer: Medicare Other | Admitting: *Deleted

## 2012-06-14 DIAGNOSIS — Z7901 Long term (current) use of anticoagulants: Secondary | ICD-10-CM

## 2012-06-14 DIAGNOSIS — I4891 Unspecified atrial fibrillation: Secondary | ICD-10-CM

## 2012-06-14 LAB — POCT INR: INR: 2.4

## 2012-06-29 ENCOUNTER — Encounter: Payer: Self-pay | Admitting: Internal Medicine

## 2012-07-11 DIAGNOSIS — L0390 Cellulitis, unspecified: Secondary | ICD-10-CM | POA: Diagnosis not present

## 2012-07-14 DIAGNOSIS — L84 Corns and callosities: Secondary | ICD-10-CM | POA: Diagnosis not present

## 2012-07-14 DIAGNOSIS — L608 Other nail disorders: Secondary | ICD-10-CM | POA: Diagnosis not present

## 2012-07-14 DIAGNOSIS — E119 Type 2 diabetes mellitus without complications: Secondary | ICD-10-CM | POA: Diagnosis not present

## 2012-07-26 DIAGNOSIS — H4011X0 Primary open-angle glaucoma, stage unspecified: Secondary | ICD-10-CM | POA: Diagnosis not present

## 2012-07-26 DIAGNOSIS — H409 Unspecified glaucoma: Secondary | ICD-10-CM | POA: Diagnosis not present

## 2012-07-26 DIAGNOSIS — E119 Type 2 diabetes mellitus without complications: Secondary | ICD-10-CM | POA: Diagnosis not present

## 2012-07-26 DIAGNOSIS — H259 Unspecified age-related cataract: Secondary | ICD-10-CM | POA: Diagnosis not present

## 2012-09-15 ENCOUNTER — Encounter: Payer: Self-pay | Admitting: Internal Medicine

## 2012-09-15 ENCOUNTER — Ambulatory Visit (INDEPENDENT_AMBULATORY_CARE_PROVIDER_SITE_OTHER): Payer: Medicare Other | Admitting: Internal Medicine

## 2012-09-15 VITALS — BP 140/82 | HR 78 | Ht 72.0 in | Wt 224.0 lb

## 2012-09-15 DIAGNOSIS — Z954 Presence of other heart-valve replacement: Secondary | ICD-10-CM | POA: Diagnosis not present

## 2012-09-15 DIAGNOSIS — I442 Atrioventricular block, complete: Secondary | ICD-10-CM | POA: Diagnosis not present

## 2012-09-15 DIAGNOSIS — Z952 Presence of prosthetic heart valve: Secondary | ICD-10-CM

## 2012-09-15 DIAGNOSIS — I4891 Unspecified atrial fibrillation: Secondary | ICD-10-CM

## 2012-09-15 LAB — PACEMAKER DEVICE OBSERVATION
BMOD-0003RV: 30
BRDY-0004RV: 120 {beats}/min
RV LEAD THRESHOLD: 0.75 V
VENTRICULAR PACING PM: 98

## 2012-09-15 NOTE — Progress Notes (Signed)
PCP: Pearson Grippe, MD Primary Cardiologist:  Dr Wilnette Kales is a 79 y.o. male who presents today for routine electrophysiology followup.  Since his recent generator change, the patient reports doing very well.  Today, he denies symptoms of palpitations, chest pain, shortness of breath,  lower extremity edema, dizziness, presyncope, or syncope.  The patient is otherwise without complaint today.   Past Medical History  Diagnosis Date  . Complete heart block 05/24/2005    Pacemaker (MDT) implanted by Dr Reyes Ivan  . CAD (coronary artery disease)     s/p CABG  . PVD (peripheral vascular disease)     s/p remote bilateral femoral bypass at [facility], [facility] at [facility]  . HTN (hypertension)   . DM (diabetes mellitus)   . Peripheral autonomic neuropathy due to DM   . Venous insufficiency   . S/P AVR (aortic valve replacement)     mechanical valve  . A-fib     Permanent afib  . Hyperlipidemia    Past Surgical History  Procedure Laterality Date  . Pacemaker insertion  05/24/2005    MDT by Dr Reyes Ivan  . Coronary artery bypass graft      at [facility]  . Carotid endarterectomy  06/25/10    at [facility]  . Femoral artery - femoral artery bypass graft      remote  . R carotid stent placement  1/13    at [facility]  . Pacemaker generator change  05/18/12    MDT Jana Half SR implanted by Dr Johney Frame    Current Outpatient Prescriptions  Medication Sig Dispense Refill  . aspirin 81 MG tablet Take 81 mg by mouth daily.        Marland Kitchen atorvastatin (LIPITOR) 40 MG tablet Take 40 mg by mouth at bedtime.        . bimatoprost (LUMIGAN) 0.03 % ophthalmic drops Place 1 drop into both eyes at bedtime.        . folic acid (FOLVITE) 1 MG tablet Take 1 mg by mouth daily.        . furosemide (LASIX) 40 MG tablet Take 40 mg by mouth daily.       Marland Kitchen gabapentin (NEURONTIN) 300 MG capsule Take 300 mg by mouth daily as needed. For feet pain      . metFORMIN (GLUCOPHAGE) 500 MG tablet Take 1,000 mg by mouth daily.       .  metoprolol (LOPRESSOR) 50 MG tablet Take 25 mg by mouth 2 (two) times daily.       . Multiple Vitamin (MULTIVITAMIN) tablet Take 1 tablet by mouth daily.        . Omega-3 Fatty Acids (FISH OIL) 1000 MG CAPS Take 1 capsule by mouth daily.       . pantoprazole (PROTONIX) 40 MG tablet Take 40 mg by mouth daily as needed. For upset stomach      . timolol (TIMOPTIC) 0.5 % ophthalmic solution Place 1 drop into both eyes daily.       . traMADol (ULTRAM) 50 MG tablet Take 50 mg by mouth every 6 (six) hours as needed. For pain      . valsartan (DIOVAN) 160 MG tablet Take 160 mg by mouth daily.        . vitamin D, CHOLECALCIFEROL, 400 UNITS tablet Take 400 Units by mouth daily.        Marland Kitchen warfarin (COUMADIN) 5 MG tablet Take 5 mg by mouth daily. Take as directed by coumadin clinic  No current facility-administered medications for this visit.    Physical Exam: Filed Vitals:   09/15/12 0912  BP: 140/82  Pulse: 78  Height: 6' (1.829 m)  Weight: 224 lb (101.606 kg)    GEN- The patient is well appearing, alert and oriented x 3 today.   Head- normocephalic, atraumatic Eyes-  Sclera clear, conjunctiva pink Ears- hearing intact Oropharynx- clear Lungs- Clear to ausculation bilaterally, normal work of breathing Chest- pacemaker pocket is well healed Heart- Regular rate and rhythm (paced) GI- soft, NT, ND, + BS Extremities- no clubbing, cyanosis, or edema  Pacemaker interrogation- reviewed in detail today,  See PACEART report  Assessment and Plan:  1. Complete heart block Normal pacemaker function See Pace Art report No changes today  2. Permanent afib Rate controlled Anticoagulated with coumadin  3. Valvular heart disease Stable  carelink every 3 months Return in 1 year

## 2012-09-15 NOTE — Patient Instructions (Addendum)
Your physician wants you to follow-up in: 12 months with Dr Allred You will receive a reminder letter in the mail two months in advance. If you don't receive a letter, please call our office to schedule the follow-up appointment.     Remote monitoring is used to monitor your Pacemaker of ICD from home. This monitoring reduces the number of office visits required to check your device to one time per year. It allows us to keep an eye on the functioning of your device to ensure it is working properly. You are scheduled for a device check from home on 12/18/12. You may send your transmission at any time that day. If you have a wireless device, the transmission will be sent automatically. After your physician reviews your transmission, you will receive a postcard with your next transmission date.   

## 2012-09-19 DIAGNOSIS — L608 Other nail disorders: Secondary | ICD-10-CM | POA: Diagnosis not present

## 2012-09-19 DIAGNOSIS — E119 Type 2 diabetes mellitus without complications: Secondary | ICD-10-CM | POA: Diagnosis not present

## 2012-09-19 DIAGNOSIS — L84 Corns and callosities: Secondary | ICD-10-CM | POA: Diagnosis not present

## 2012-11-08 DIAGNOSIS — E119 Type 2 diabetes mellitus without complications: Secondary | ICD-10-CM | POA: Diagnosis not present

## 2012-11-08 DIAGNOSIS — Z0000 Encounter for general adult medical examination without abnormal findings: Secondary | ICD-10-CM | POA: Diagnosis not present

## 2012-11-08 DIAGNOSIS — I10 Essential (primary) hypertension: Secondary | ICD-10-CM | POA: Diagnosis not present

## 2012-11-08 DIAGNOSIS — Z125 Encounter for screening for malignant neoplasm of prostate: Secondary | ICD-10-CM | POA: Diagnosis not present

## 2012-11-13 DIAGNOSIS — E7800 Pure hypercholesterolemia, unspecified: Secondary | ICD-10-CM | POA: Diagnosis not present

## 2012-11-13 DIAGNOSIS — E119 Type 2 diabetes mellitus without complications: Secondary | ICD-10-CM | POA: Diagnosis not present

## 2012-11-13 DIAGNOSIS — I10 Essential (primary) hypertension: Secondary | ICD-10-CM | POA: Diagnosis not present

## 2012-11-13 DIAGNOSIS — I4891 Unspecified atrial fibrillation: Secondary | ICD-10-CM | POA: Diagnosis not present

## 2012-11-13 DIAGNOSIS — L570 Actinic keratosis: Secondary | ICD-10-CM | POA: Diagnosis not present

## 2012-11-21 DIAGNOSIS — L84 Corns and callosities: Secondary | ICD-10-CM | POA: Diagnosis not present

## 2012-11-21 DIAGNOSIS — E119 Type 2 diabetes mellitus without complications: Secondary | ICD-10-CM | POA: Diagnosis not present

## 2012-11-21 DIAGNOSIS — L608 Other nail disorders: Secondary | ICD-10-CM | POA: Diagnosis not present

## 2012-12-18 ENCOUNTER — Encounter: Payer: Medicare Other | Admitting: *Deleted

## 2012-12-25 DIAGNOSIS — H409 Unspecified glaucoma: Secondary | ICD-10-CM | POA: Diagnosis not present

## 2012-12-25 DIAGNOSIS — H259 Unspecified age-related cataract: Secondary | ICD-10-CM | POA: Diagnosis not present

## 2012-12-25 DIAGNOSIS — H01009 Unspecified blepharitis unspecified eye, unspecified eyelid: Secondary | ICD-10-CM | POA: Diagnosis not present

## 2012-12-25 DIAGNOSIS — H4011X0 Primary open-angle glaucoma, stage unspecified: Secondary | ICD-10-CM | POA: Diagnosis not present

## 2012-12-27 ENCOUNTER — Encounter: Payer: Self-pay | Admitting: *Deleted

## 2012-12-28 ENCOUNTER — Encounter: Payer: Self-pay | Admitting: *Deleted

## 2013-01-02 ENCOUNTER — Ambulatory Visit (INDEPENDENT_AMBULATORY_CARE_PROVIDER_SITE_OTHER): Payer: Medicare Other | Admitting: *Deleted

## 2013-01-02 DIAGNOSIS — I4891 Unspecified atrial fibrillation: Secondary | ICD-10-CM

## 2013-01-02 MED ORDER — WARFARIN SODIUM 5 MG PO TABS: 5.0000 mg | ORAL_TABLET | Freq: Every day | ORAL | Status: DC

## 2013-01-05 DIAGNOSIS — I6529 Occlusion and stenosis of unspecified carotid artery: Secondary | ICD-10-CM | POA: Diagnosis not present

## 2013-01-10 DIAGNOSIS — R109 Unspecified abdominal pain: Secondary | ICD-10-CM | POA: Diagnosis not present

## 2013-01-10 DIAGNOSIS — G589 Mononeuropathy, unspecified: Secondary | ICD-10-CM | POA: Diagnosis not present

## 2013-01-23 DIAGNOSIS — L608 Other nail disorders: Secondary | ICD-10-CM | POA: Diagnosis not present

## 2013-01-23 DIAGNOSIS — L84 Corns and callosities: Secondary | ICD-10-CM | POA: Diagnosis not present

## 2013-01-23 DIAGNOSIS — E119 Type 2 diabetes mellitus without complications: Secondary | ICD-10-CM | POA: Diagnosis not present

## 2013-01-29 DIAGNOSIS — Z23 Encounter for immunization: Secondary | ICD-10-CM | POA: Diagnosis not present

## 2013-01-30 ENCOUNTER — Ambulatory Visit (INDEPENDENT_AMBULATORY_CARE_PROVIDER_SITE_OTHER): Payer: Medicare Other | Admitting: *Deleted

## 2013-01-30 DIAGNOSIS — I4891 Unspecified atrial fibrillation: Secondary | ICD-10-CM | POA: Diagnosis not present

## 2013-01-30 LAB — POCT INR: INR: 4.1

## 2013-01-31 DIAGNOSIS — E119 Type 2 diabetes mellitus without complications: Secondary | ICD-10-CM | POA: Diagnosis not present

## 2013-01-31 DIAGNOSIS — G589 Mononeuropathy, unspecified: Secondary | ICD-10-CM | POA: Diagnosis not present

## 2013-01-31 DIAGNOSIS — I10 Essential (primary) hypertension: Secondary | ICD-10-CM | POA: Diagnosis not present

## 2013-01-31 DIAGNOSIS — E039 Hypothyroidism, unspecified: Secondary | ICD-10-CM | POA: Diagnosis not present

## 2013-02-09 ENCOUNTER — Ambulatory Visit (INDEPENDENT_AMBULATORY_CARE_PROVIDER_SITE_OTHER): Payer: Medicare Other | Admitting: General Practice

## 2013-02-09 DIAGNOSIS — I4891 Unspecified atrial fibrillation: Secondary | ICD-10-CM

## 2013-03-02 ENCOUNTER — Ambulatory Visit (INDEPENDENT_AMBULATORY_CARE_PROVIDER_SITE_OTHER): Payer: Medicare Other | Admitting: *Deleted

## 2013-03-02 DIAGNOSIS — I4891 Unspecified atrial fibrillation: Secondary | ICD-10-CM

## 2013-03-27 DIAGNOSIS — L608 Other nail disorders: Secondary | ICD-10-CM | POA: Diagnosis not present

## 2013-03-27 DIAGNOSIS — L84 Corns and callosities: Secondary | ICD-10-CM | POA: Diagnosis not present

## 2013-03-27 DIAGNOSIS — E119 Type 2 diabetes mellitus without complications: Secondary | ICD-10-CM | POA: Diagnosis not present

## 2013-05-31 DIAGNOSIS — I2510 Atherosclerotic heart disease of native coronary artery without angina pectoris: Secondary | ICD-10-CM | POA: Diagnosis not present

## 2013-05-31 DIAGNOSIS — I10 Essential (primary) hypertension: Secondary | ICD-10-CM | POA: Diagnosis not present

## 2013-05-31 DIAGNOSIS — E119 Type 2 diabetes mellitus without complications: Secondary | ICD-10-CM | POA: Diagnosis not present

## 2013-05-31 DIAGNOSIS — E039 Hypothyroidism, unspecified: Secondary | ICD-10-CM | POA: Diagnosis not present

## 2013-06-04 DIAGNOSIS — E7800 Pure hypercholesterolemia, unspecified: Secondary | ICD-10-CM | POA: Diagnosis not present

## 2013-06-04 DIAGNOSIS — E119 Type 2 diabetes mellitus without complications: Secondary | ICD-10-CM | POA: Diagnosis not present

## 2013-06-04 DIAGNOSIS — I10 Essential (primary) hypertension: Secondary | ICD-10-CM | POA: Diagnosis not present

## 2013-06-04 DIAGNOSIS — E039 Hypothyroidism, unspecified: Secondary | ICD-10-CM | POA: Diagnosis not present

## 2013-06-27 DIAGNOSIS — H409 Unspecified glaucoma: Secondary | ICD-10-CM | POA: Diagnosis not present

## 2013-06-27 DIAGNOSIS — H4011X0 Primary open-angle glaucoma, stage unspecified: Secondary | ICD-10-CM | POA: Diagnosis not present

## 2013-06-27 DIAGNOSIS — H01009 Unspecified blepharitis unspecified eye, unspecified eyelid: Secondary | ICD-10-CM | POA: Diagnosis not present

## 2013-06-27 DIAGNOSIS — H259 Unspecified age-related cataract: Secondary | ICD-10-CM | POA: Diagnosis not present

## 2013-07-14 ENCOUNTER — Other Ambulatory Visit: Payer: Self-pay | Admitting: Internal Medicine

## 2013-09-06 DIAGNOSIS — Z125 Encounter for screening for malignant neoplasm of prostate: Secondary | ICD-10-CM | POA: Diagnosis not present

## 2013-09-06 DIAGNOSIS — E7800 Pure hypercholesterolemia, unspecified: Secondary | ICD-10-CM | POA: Diagnosis not present

## 2013-09-06 DIAGNOSIS — E119 Type 2 diabetes mellitus without complications: Secondary | ICD-10-CM | POA: Diagnosis not present

## 2013-09-06 DIAGNOSIS — I10 Essential (primary) hypertension: Secondary | ICD-10-CM | POA: Diagnosis not present

## 2013-09-12 DIAGNOSIS — I10 Essential (primary) hypertension: Secondary | ICD-10-CM | POA: Diagnosis not present

## 2013-09-12 DIAGNOSIS — E7800 Pure hypercholesterolemia, unspecified: Secondary | ICD-10-CM | POA: Diagnosis not present

## 2013-09-12 DIAGNOSIS — E119 Type 2 diabetes mellitus without complications: Secondary | ICD-10-CM | POA: Diagnosis not present

## 2013-09-19 ENCOUNTER — Encounter: Payer: Self-pay | Admitting: Internal Medicine

## 2013-09-19 ENCOUNTER — Ambulatory Visit (INDEPENDENT_AMBULATORY_CARE_PROVIDER_SITE_OTHER): Payer: Medicare Other | Admitting: Pharmacist

## 2013-09-19 ENCOUNTER — Ambulatory Visit (INDEPENDENT_AMBULATORY_CARE_PROVIDER_SITE_OTHER): Payer: Medicare Other | Admitting: Internal Medicine

## 2013-09-19 VITALS — BP 149/78 | HR 76 | Ht 72.0 in | Wt 201.0 lb

## 2013-09-19 DIAGNOSIS — I4891 Unspecified atrial fibrillation: Secondary | ICD-10-CM

## 2013-09-19 DIAGNOSIS — Z954 Presence of other heart-valve replacement: Secondary | ICD-10-CM

## 2013-09-19 DIAGNOSIS — I442 Atrioventricular block, complete: Secondary | ICD-10-CM

## 2013-09-19 DIAGNOSIS — I2510 Atherosclerotic heart disease of native coronary artery without angina pectoris: Secondary | ICD-10-CM

## 2013-09-19 DIAGNOSIS — I10 Essential (primary) hypertension: Secondary | ICD-10-CM | POA: Diagnosis not present

## 2013-09-19 DIAGNOSIS — Z952 Presence of prosthetic heart valve: Secondary | ICD-10-CM

## 2013-09-19 LAB — MDC_IDC_ENUM_SESS_TYPE_INCLINIC
Battery Impedance: 233 Ohm
Battery Remaining Longevity: 93 mo
Brady Statistic RV Percent Paced: 96 %
Date Time Interrogation Session: 20150520094516
Lead Channel Impedance Value: 0 Ohm
Lead Channel Impedance Value: 525 Ohm
Lead Channel Sensing Intrinsic Amplitude: 11.2 mV
MDC IDC MSMT BATTERY VOLTAGE: 2.79 V
MDC IDC MSMT LEADCHNL RV PACING THRESHOLD AMPLITUDE: 0.75 V
MDC IDC MSMT LEADCHNL RV PACING THRESHOLD PULSEWIDTH: 0.4 ms
MDC IDC SET LEADCHNL RV PACING AMPLITUDE: 2.5 V
MDC IDC SET LEADCHNL RV PACING PULSEWIDTH: 0.4 ms
MDC IDC SET LEADCHNL RV SENSING SENSITIVITY: 5.6 mV

## 2013-09-19 LAB — POCT INR: INR: 1.8

## 2013-09-19 MED ORDER — WARFARIN SODIUM 5 MG PO TABS: ORAL_TABLET | ORAL | Status: DC

## 2013-09-19 NOTE — Progress Notes (Signed)
PCP: Jeffrey Gravel, MD Primary Cardiologist:  Dr Jeffrey Whitney has not seen in several years  Jeffrey Whitney is a 80 y.o. male who presents today for routine electrophysiology followup.  Since his last visit to see me, the patient reports doing very well.  He has not been compliant with INR checks.  I am concerned that he seems to have perhaps a mild degree of cognative impairment.  Though he has been on coumadin for years, he is not able to tell me who follows his coumadin.   Today, he denies symptoms of palpitations, chest pain, shortness of breath,  lower extremity edema, dizziness, presyncope, or syncope.  The patient is otherwise without complaint today.   Past Medical History  Diagnosis Date  . Complete heart block 05/24/2005    Pacemaker (MDT) implanted by Dr Jeffrey Whitney  . CAD (coronary artery disease)     s/p CABG  . PVD (peripheral vascular disease)     s/p remote bilateral femoral bypass at [facility] at [facility]  . HTN (hypertension)   . DM (diabetes mellitus)   . Peripheral autonomic neuropathy due to DM   . Venous insufficiency   . S/P AVR (aortic valve replacement)     mechanical valve  . A-fib     Permanent afib  . Hyperlipidemia    Past Surgical History  Procedure Laterality Date  . Pacemaker insertion  05/24/2005    MDT by Dr Jeffrey Whitney  . Coronary artery bypass graft      at [facility]  . Carotid endarterectomy  06/25/10    at [facility]  . Femoral artery - femoral artery bypass graft      remote  . R carotid stent placement  1/13    at [facility]  . Pacemaker generator change  05/18/12    MDT [facility] SR implanted by Dr Jeffrey Whitney    Current Outpatient Prescriptions  Medication Sig Dispense Refill  . aspirin 81 MG tablet Take 81 mg by mouth as needed.       Marland Kitchen atorvastatin (LIPITOR) 40 MG tablet Take 40 mg by mouth at bedtime.        . bimatoprost (LUMIGAN) 0.03 % ophthalmic drops Place 1 drop into both eyes at bedtime.        . folic acid (FOLVITE) 1 MG tablet Take 1 mg by mouth daily.         . furosemide (LASIX) 40 MG tablet Take 40 mg by mouth daily.       Marland Kitchen gabapentin (NEURONTIN) 300 MG capsule Take 300 mg by mouth daily as needed. For foot pain      . metFORMIN (GLUCOPHAGE) 500 MG tablet Take 1,000 mg by mouth daily.       . metoprolol (LOPRESSOR) 50 MG tablet Take 25 mg by mouth 2 (two) times daily.       . Multiple Vitamin (MULTIVITAMIN) tablet Take 1 tablet by mouth daily.        . traMADol (ULTRAM) 50 MG tablet Take 50 mg by mouth every 6 (six) hours as needed. For pain      . valsartan (DIOVAN) 160 MG tablet Take 160 mg by mouth daily.        . vitamin D, CHOLECALCIFEROL, 400 UNITS tablet Take 400 Units by mouth daily.        Marland Kitchen warfarin (COUMADIN) 5 MG tablet Take as directed by coumadin clinic       No current facility-administered medications for this visit.    Physical Exam: Jeffrey Whitney  Vitals:   09/19/13 0925  BP: 149/78  Pulse: 76  Height: 6' (1.829 m)  Weight: 201 lb (91.173 kg)    GEN- The patient is well appearing, alert and oriented x 3 today.   Head- normocephalic, atraumatic Eyes-  Sclera clear, conjunctiva pink Ears- hearing intact Oropharynx- clear Lungs- Clear to ausculation bilaterally, normal work of breathing Chest- pacemaker pocket is well healed Heart- Regular rate and rhythm (paced), aortic valve closure is not very crisp GI- soft, NT, ND, + BS Extremities- no clubbing, cyanosis, +1 edema  Pacemaker interrogation- reviewed in detail today,  See PACEART report  Assessment and Plan:  1. Complete heart block Normal pacemaker function See Pace Art report No changes today  2. Permanent afib Rate controlled Anticoagulated with coumadin but without adequate follow-up.  This is very concerning.  I will enroll back into our coumadin clinic today.  3. Valvular heart disease He has a mechanical AVR but has not been compliant with coumadin.  I will enroll in the coumadin clinic today Obtain an Echo (last echo in epic was 2006)  Follow-up  with Dr Jeffrey Whitney at next available Follow-up closely with coumadin clinic Return in 1 year to see Jeffrey Whitney in the device clinic

## 2013-09-19 NOTE — Patient Instructions (Addendum)
Your physician wants you to follow-up in: 3 months with Dr Mare Ferrari and 12 months with Peter Garter, PA You will receive a reminder letter in the mail two months in advance. If you don't receive a letter, please call our office to schedule the follow-up appointment.  Coumadin checked today  Your physician has requested that you have an echocardiogram. Echocardiography is a painless test that uses sound waves to create images of your heart. It provides your doctor with information about the size and shape of your heart and how well your heart's chambers and valves are working. This procedure takes approximately one hour. There are no restrictions for this procedure.

## 2013-09-21 ENCOUNTER — Encounter: Payer: Self-pay | Admitting: Internal Medicine

## 2013-09-28 ENCOUNTER — Ambulatory Visit (INDEPENDENT_AMBULATORY_CARE_PROVIDER_SITE_OTHER): Payer: Medicare Other | Admitting: Surgery

## 2013-09-28 DIAGNOSIS — I4891 Unspecified atrial fibrillation: Secondary | ICD-10-CM

## 2013-09-28 DIAGNOSIS — Z954 Presence of other heart-valve replacement: Secondary | ICD-10-CM | POA: Diagnosis not present

## 2013-09-28 DIAGNOSIS — Z952 Presence of prosthetic heart valve: Secondary | ICD-10-CM

## 2013-09-28 LAB — POCT INR: INR: 2.1

## 2013-09-28 MED ORDER — WARFARIN SODIUM 5 MG PO TABS: ORAL_TABLET | ORAL | Status: DC

## 2013-10-09 ENCOUNTER — Ambulatory Visit (HOSPITAL_COMMUNITY): Payer: Medicare Other | Attending: Cardiovascular Disease | Admitting: Radiology

## 2013-10-09 DIAGNOSIS — I359 Nonrheumatic aortic valve disorder, unspecified: Secondary | ICD-10-CM | POA: Diagnosis not present

## 2013-10-09 DIAGNOSIS — I4891 Unspecified atrial fibrillation: Secondary | ICD-10-CM | POA: Diagnosis not present

## 2013-10-09 NOTE — Progress Notes (Signed)
Echocardiogram performed.  

## 2013-10-12 ENCOUNTER — Ambulatory Visit (INDEPENDENT_AMBULATORY_CARE_PROVIDER_SITE_OTHER): Payer: Medicare Other | Admitting: *Deleted

## 2013-10-12 DIAGNOSIS — I4891 Unspecified atrial fibrillation: Secondary | ICD-10-CM | POA: Diagnosis not present

## 2013-10-12 LAB — POCT INR: INR: 2.1

## 2013-10-23 DIAGNOSIS — L608 Other nail disorders: Secondary | ICD-10-CM | POA: Diagnosis not present

## 2013-10-23 DIAGNOSIS — L84 Corns and callosities: Secondary | ICD-10-CM | POA: Diagnosis not present

## 2013-10-23 DIAGNOSIS — E119 Type 2 diabetes mellitus without complications: Secondary | ICD-10-CM | POA: Diagnosis not present

## 2013-11-05 ENCOUNTER — Ambulatory Visit (INDEPENDENT_AMBULATORY_CARE_PROVIDER_SITE_OTHER): Payer: Medicare Other | Admitting: Pharmacist

## 2013-11-05 DIAGNOSIS — I4891 Unspecified atrial fibrillation: Secondary | ICD-10-CM

## 2013-11-05 LAB — POCT INR: INR: 3.1

## 2013-11-16 ENCOUNTER — Ambulatory Visit (INDEPENDENT_AMBULATORY_CARE_PROVIDER_SITE_OTHER): Payer: Medicare Other | Admitting: Cardiology

## 2013-11-16 ENCOUNTER — Encounter: Payer: Self-pay | Admitting: Cardiology

## 2013-11-16 VITALS — BP 132/82 | HR 81 | Ht 72.0 in | Wt 194.6 lb

## 2013-11-16 DIAGNOSIS — R634 Abnormal weight loss: Secondary | ICD-10-CM | POA: Diagnosis not present

## 2013-11-16 DIAGNOSIS — I2510 Atherosclerotic heart disease of native coronary artery without angina pectoris: Secondary | ICD-10-CM

## 2013-11-16 DIAGNOSIS — Z954 Presence of other heart-valve replacement: Secondary | ICD-10-CM | POA: Diagnosis not present

## 2013-11-16 DIAGNOSIS — I10 Essential (primary) hypertension: Secondary | ICD-10-CM | POA: Diagnosis not present

## 2013-11-16 DIAGNOSIS — H259 Unspecified age-related cataract: Secondary | ICD-10-CM | POA: Diagnosis not present

## 2013-11-16 DIAGNOSIS — Z952 Presence of prosthetic heart valve: Secondary | ICD-10-CM

## 2013-11-16 DIAGNOSIS — I4891 Unspecified atrial fibrillation: Secondary | ICD-10-CM

## 2013-11-16 DIAGNOSIS — I519 Heart disease, unspecified: Secondary | ICD-10-CM | POA: Insufficient documentation

## 2013-11-16 DIAGNOSIS — E119 Type 2 diabetes mellitus without complications: Secondary | ICD-10-CM | POA: Diagnosis not present

## 2013-11-16 DIAGNOSIS — I482 Chronic atrial fibrillation, unspecified: Secondary | ICD-10-CM

## 2013-11-16 DIAGNOSIS — I5189 Other ill-defined heart diseases: Secondary | ICD-10-CM

## 2013-11-16 DIAGNOSIS — H4011X0 Primary open-angle glaucoma, stage unspecified: Secondary | ICD-10-CM | POA: Diagnosis not present

## 2013-11-16 DIAGNOSIS — H409 Unspecified glaucoma: Secondary | ICD-10-CM | POA: Diagnosis not present

## 2013-11-16 LAB — BASIC METABOLIC PANEL
BUN: 17 mg/dL (ref 6–23)
CALCIUM: 9.2 mg/dL (ref 8.4–10.5)
CO2: 28 mEq/L (ref 19–32)
Chloride: 105 mEq/L (ref 96–112)
Creatinine, Ser: 1 mg/dL (ref 0.4–1.5)
GFR: 75.26 mL/min (ref >60.00)
Glucose, Bld: 98 mg/dL (ref 70–99)
Potassium: 4 mEq/L (ref 3.5–5.1)
Sodium: 142 mEq/L (ref 135–145)

## 2013-11-16 LAB — CBC WITH DIFFERENTIAL/PLATELET
BASOS PCT: 0.3 % (ref 0.0–3.0)
Basophils Absolute: 0 10*3/uL (ref 0.0–0.1)
EOS ABS: 0.3 10*3/uL (ref 0.0–0.7)
EOS PCT: 3.6 % (ref 0.0–5.0)
HCT: 43 % (ref 39.0–52.0)
HEMOGLOBIN: 14.4 g/dL (ref 13.0–17.0)
LYMPHS PCT: 21.1 % (ref 12.0–46.0)
Lymphs Abs: 1.9 10*3/uL (ref 0.7–4.0)
MCHC: 33.5 g/dL (ref 30.0–36.0)
MCV: 95.1 fl (ref 78.0–100.0)
MONOS PCT: 9.7 % (ref 3.0–12.0)
Monocytes Absolute: 0.9 10*3/uL (ref 0.1–1.0)
NEUTROS ABS: 5.8 10*3/uL (ref 1.4–7.7)
NEUTROS PCT: 65.3 % (ref 43.0–77.0)
Platelets: 141 10*3/uL — ABNORMAL LOW (ref 150.0–400.0)
RBC: 4.52 Mil/uL (ref 4.22–5.81)
RDW: 13.9 % (ref 11.5–15.5)
WBC: 8.9 10*3/uL (ref 4.0–10.5)

## 2013-11-16 LAB — HEPATIC FUNCTION PANEL
ALBUMIN: 4.1 g/dL (ref 3.5–5.2)
ALT: 34 U/L (ref 0–53)
AST: 35 U/L (ref 0–37)
Alkaline Phosphatase: 78 U/L (ref 39–117)
Bilirubin, Direct: 0.3 mg/dL (ref 0.0–0.3)
TOTAL PROTEIN: 7.5 g/dL (ref 6.0–8.3)
Total Bilirubin: 1.8 mg/dL — ABNORMAL HIGH (ref 0.2–1.2)

## 2013-11-16 LAB — TSH: TSH: 0.81 u[IU]/mL (ref 0.35–4.50)

## 2013-11-16 NOTE — Assessment & Plan Note (Signed)
The patient has not been having symptoms of orthopnea or paroxysmal nocturnal dyspnea.  He does have mild chronic peripheral edema worse on the right.  We are checking a chest x-ray.

## 2013-11-16 NOTE — Progress Notes (Signed)
[name] Date of Birth:  01-17-1931 [place] [address] [place], [place]  [address] [phone]        Fax   [phone]   History of Present Illness: This 80 year old gentleman is seen by me for the first time in the office today.  He is a former patient of Dr. Doreatha Lew.  Review of Dr. Susa Simmonds old records reveals that he has a history of coronary artery disease with previous bypass surgery.  He has a history of a bioprosthetic aortic valve replacement.  He has a history of chronic atrial fibrillation and a history of complete heart block with permanent transvenous pacemaker insertion.  He has dyslipidemia and diabetes mellitus.  A stress test in June 2011 showed no ischemia and his ejection fraction was 42%.  His medical Dr. is Dr. Jani Gravel  He initially had coronary artery bypass grafting in 1983.  He then had redo coronary artery bypass grafting in 1996.  He had a porcine aortic valve replacement   by Dr. Evelina Dun at [facility].  He has been followed in our Coumadin clinic since Dr. Susa Simmonds retirement.  He has also been followed in our pacemaker clinic by Dr. Rayann Heman. The patient had a recent echocardiogram on 10/09/13: - Left ventricle: Septal apical and inferior wall hypokinesis. The cavity size was moderately dilated. Wall thickness was increased in a pattern of severe LVH. Systolic function was moderately to severely reduced. The estimated ejection fraction was in the range of 30% to 35%. - Aortic valve: There was very mild stenosis. - Mitral valve: Calcified annulus. There was mild regurgitation. - Left atrium: The atrium was moderately to severely dilated. - Right atrium: The atrium was mildly dilated. - Atrial septum: No defect or patent foramen ovale was identified. The patient reports that he has been feeling well.  He denies any chest pain or shortness of breath he has not been aware of any palpitations.  He has had memory loss.  He states that he  is also been losing weight unintentionally. Current Outpatient Prescriptions  Medication Sig Dispense Refill  . aspirin 81 MG tablet Take 81 mg by mouth as needed.       Marland Kitchen atorvastatin (LIPITOR) 40 MG tablet Take 40 mg by mouth at bedtime.        . bimatoprost (LUMIGAN) 0.03 % ophthalmic drops Place 1 drop into both eyes at bedtime.        . folic acid (FOLVITE) 1 MG tablet Take 1 mg by mouth daily.        . furosemide (LASIX) 40 MG tablet Take 40 mg by mouth daily.       Marland Kitchen gabapentin (NEURONTIN) 300 MG capsule Take 300 mg by mouth daily as needed. For foot pain      . metFORMIN (GLUCOPHAGE) 500 MG tablet Take 1,000 mg by mouth daily.       . metoprolol (LOPRESSOR) 50 MG tablet Take 25 mg by mouth 2 (two) times daily.       . Multiple Vitamin (MULTIVITAMIN) tablet Take 1 tablet by mouth daily.        . traMADol (ULTRAM) 50 MG tablet Take 50 mg by mouth every 6 (six) hours as needed. For pain      . valsartan (DIOVAN) 160 MG tablet Take 160 mg by mouth daily.        . vitamin D, CHOLECALCIFEROL, 400 UNITS tablet Take 400 Units by mouth daily.        Marland Kitchen  warfarin (COUMADIN) 5 MG tablet Take as directed by coumadin clinic  40 tablet  3   No current facility-administered medications for this visit.    No Known Allergies  Patient Active Problem List   Diagnosis Date Noted  . Weight loss, unintentional 11/16/2013  . Atrioventricular block, complete 07/30/2010  . Essential hypertension, benign 07/30/2010  . Coronary atherosclerosis 07/30/2010  . S/P AVR (aortic valve replacement) 07/30/2010  . PVD (peripheral vascular disease) 07/30/2010  . Atrial fibrillation 07/27/2010    History  Smoking status  . Former Smoker  Smokeless tobacco  . Not on file    History  Alcohol Use No    Family History  Problem Relation Age of Onset  . Coronary artery disease      Review of Systems: Constitutional: no fever chills diaphoresis or fatigue or change in weight.  Head and neck: no hearing  loss, no epistaxis, no photophobia or visual disturbance. Respiratory: No cough, shortness of breath or wheezing. Cardiovascular: No chest pain peripheral edema, palpitations. Gastrointestinal: No abdominal distention, no abdominal pain, no change in bowel habits hematochezia or melena. Genitourinary: No dysuria, no frequency, no urgency, no nocturia. Musculoskeletal:No arthralgias, no back pain, no gait disturbance or myalgias. Neurological: No dizziness, no headaches, no numbness, no seizures, no syncope, no weakness, no tremors. Hematologic: No lymphadenopathy, no easy bruising. Psychiatric: No confusion, no hallucinations, no sleep disturbance.    Physical Exam: Filed Vitals:   11/16/13 1508  BP: 132/82  Pulse: 81   the general appearance reveals a pleasant gentleman in no distress. He does have memory loss and is not a good historian.The head and neck exam reveals pupils equal and reactive.  Extraocular movements are full.  There is no scleral icterus.  The mouth and pharynx are normal.  The neck is supple.  The carotids reveal no bruits.  The jugular venous pressure is normal.  The  thyroid is not enlarged.  There is no lymphadenopathy.  The chest is clear to percussion and auscultation.  There are no rales or rhonchi.  Expansion of the chest is symmetrical.  The precordium is quiet.  The first heart sound is normal.  The second heart sound is physiologically split.  There is grade 1/6 systolic ejection murmur across the prosthetic aortic valve.  No diastolic murmur.  There is no abnormal lift or heave.  The abdomen is soft and nontender.  The bowel sounds are normal.  The liver and spleen are not enlarged.  There are no abdominal masses.  There are no abdominal bruits.  Extremities reveal good pedal pulses.  There is mild bilateral edema worse on the right  There is no cyanosis or clubbing.  Strength is normal and symmetrical in all extremities.  There is no lateralizing weakness.  There are  no sensory deficits.  The skin is warm and dry.  There is no rash.     Assessment / Plan: 1.  Ischemic heart disease status post CABG originally in 1983 and then redo CABG in 1996.  Ischemic cardiomyopathy with left ventricular dysfunction.  No symptoms of heart failure. 2. porcine aortic valve replacement by Dr. Evelina Dun at [facility] for  severe aortic stenosis 3. memory loss 4. permanent atrial fibrillation on Coumadin 5. functioning pacemaker followed by Dr. Rayann Heman 6. weight loss, non-intentional  Plan: Continue same medication.  Okay chest x-ray and lab work today.  Recheck in 4 months for cardiology followup.

## 2013-11-16 NOTE — Assessment & Plan Note (Signed)
Patient has had unintentional weight loss.  His last chest x-ray in our system was in 2007.  At that time he showed cardiomegaly.  We will update his chest x-ray.  He denies any change in bowel habits.  We will get baseline lab work today including thyroid function liver function electrolytes and CBC

## 2013-11-16 NOTE — Assessment & Plan Note (Signed)
The patient's heart rate is adequately controlled on current regimen of metoprolol.  He is on long-term Coumadin for his atrial fibrillation.  He has a bioprosthetic aortic valve.

## 2013-11-16 NOTE — Assessment & Plan Note (Signed)
The patient has had 2 coronary artery bypass graft operations according to Dr. Susa Simmonds old records.  He has not had any recurrent angina pectoris.

## 2013-11-16 NOTE — Patient Instructions (Signed)
Will obtain labs today and call you with the results (bmet/cbc/tsh/hfp)  A chest x-ray takes a picture of the organs and structures inside the chest, including the heart, lungs, and blood vessels. This test can show several things, including, whether the heart is enlarges; whether fluid is building up in the lungs; and whether pacemaker / defibrillator leads are still in place. [place]   Your physician wants you to follow-up in: 4 month ov  You will receive a reminder letter in the mail two months in advance. If you don't receive a letter, please call our office to schedule the follow-up appointment.

## 2013-11-18 NOTE — Progress Notes (Signed)
Quick Note:  Please report to patient. The recent labs are stable. Continue same medication and careful diet. ______ 

## 2013-11-19 ENCOUNTER — Telehealth: Payer: Self-pay | Admitting: *Deleted

## 2013-11-19 NOTE — Telephone Encounter (Signed)
Message copied by Earvin Hansen on Mon Nov 19, 2013  5:57 PM ------      Message from: Darlin Coco      Created: Sun Nov 18, 2013  8:14 AM       Please report to patient.  The recent labs are stable. Continue same medication and careful diet. ------

## 2013-11-19 NOTE — Telephone Encounter (Signed)
Did remind patient needed to get chest xray, stated he would go this week   Advised patient of lab results

## 2013-11-20 ENCOUNTER — Ambulatory Visit
Admission: RE | Admit: 2013-11-20 | Discharge: 2013-11-20 | Disposition: A | Discharge status: Home / Self Care | Payer: Medicare Other | Source: Ambulatory Visit | Attending: Cardiology | Admitting: Cardiology

## 2013-11-20 DIAGNOSIS — R634 Abnormal weight loss: Secondary | ICD-10-CM

## 2013-11-20 DIAGNOSIS — Z952 Presence of prosthetic heart valve: Secondary | ICD-10-CM

## 2013-11-21 NOTE — Telephone Encounter (Signed)
Message copied by Earvin Hansen on Wed Nov 21, 2013  1:06 PM ------      Message from: Darlin Coco      Created: Tue Nov 20, 2013  8:16 PM       Please report.  Chest xray is satisfactory. CSD. ------

## 2013-11-21 NOTE — Telephone Encounter (Signed)
Advised patient

## 2013-11-28 ENCOUNTER — Emergency Department (HOSPITAL_COMMUNITY): Payer: Medicare Other

## 2013-11-28 ENCOUNTER — Other Ambulatory Visit: Payer: Self-pay

## 2013-11-28 ENCOUNTER — Encounter (HOSPITAL_COMMUNITY): Payer: Self-pay | Admitting: Emergency Medicine

## 2013-11-28 ENCOUNTER — Emergency Department (HOSPITAL_COMMUNITY)
Admission: EM | Admit: 2013-11-28 | Discharge: 2013-11-29 | Disposition: A | Discharge status: Home / Self Care | Payer: Medicare Other | Attending: Emergency Medicine | Admitting: Emergency Medicine

## 2013-11-28 DIAGNOSIS — Y9289 Other specified places as the place of occurrence of the external cause: Secondary | ICD-10-CM | POA: Insufficient documentation

## 2013-11-28 DIAGNOSIS — Z9861 Coronary angioplasty status: Secondary | ICD-10-CM | POA: Diagnosis not present

## 2013-11-28 DIAGNOSIS — Z87891 Personal history of nicotine dependence: Secondary | ICD-10-CM | POA: Diagnosis not present

## 2013-11-28 DIAGNOSIS — Z7982 Long term (current) use of aspirin: Secondary | ICD-10-CM | POA: Insufficient documentation

## 2013-11-28 DIAGNOSIS — Y9389 Activity, other specified: Secondary | ICD-10-CM | POA: Insufficient documentation

## 2013-11-28 DIAGNOSIS — I2510 Atherosclerotic heart disease of native coronary artery without angina pectoris: Secondary | ICD-10-CM | POA: Insufficient documentation

## 2013-11-28 DIAGNOSIS — R5381 Other malaise: Secondary | ICD-10-CM | POA: Diagnosis not present

## 2013-11-28 DIAGNOSIS — J984 Other disorders of lung: Secondary | ICD-10-CM | POA: Diagnosis not present

## 2013-11-28 DIAGNOSIS — R296 Repeated falls: Secondary | ICD-10-CM | POA: Insufficient documentation

## 2013-11-28 DIAGNOSIS — Z79899 Other long term (current) drug therapy: Secondary | ICD-10-CM | POA: Insufficient documentation

## 2013-11-28 DIAGNOSIS — E785 Hyperlipidemia, unspecified: Secondary | ICD-10-CM | POA: Insufficient documentation

## 2013-11-28 DIAGNOSIS — G909 Disorder of the autonomic nervous system, unspecified: Secondary | ICD-10-CM | POA: Insufficient documentation

## 2013-11-28 DIAGNOSIS — Z954 Presence of other heart-valve replacement: Secondary | ICD-10-CM | POA: Insufficient documentation

## 2013-11-28 DIAGNOSIS — R531 Weakness: Secondary | ICD-10-CM

## 2013-11-28 DIAGNOSIS — E1149 Type 2 diabetes mellitus with other diabetic neurological complication: Secondary | ICD-10-CM | POA: Diagnosis not present

## 2013-11-28 DIAGNOSIS — Z951 Presence of aortocoronary bypass graft: Secondary | ICD-10-CM | POA: Diagnosis not present

## 2013-11-28 DIAGNOSIS — I10 Essential (primary) hypertension: Secondary | ICD-10-CM | POA: Insufficient documentation

## 2013-11-28 DIAGNOSIS — Z043 Encounter for examination and observation following other accident: Secondary | ICD-10-CM | POA: Insufficient documentation

## 2013-11-28 DIAGNOSIS — R404 Transient alteration of awareness: Secondary | ICD-10-CM | POA: Diagnosis not present

## 2013-11-28 DIAGNOSIS — R5383 Other fatigue: Secondary | ICD-10-CM | POA: Diagnosis not present

## 2013-11-28 DIAGNOSIS — R17 Unspecified jaundice: Secondary | ICD-10-CM

## 2013-11-28 LAB — PROTIME-INR
INR: 2.02 — ABNORMAL HIGH (ref 0.00–1.49)
Prothrombin Time: 22.9 seconds — ABNORMAL HIGH (ref 11.6–15.2)

## 2013-11-28 NOTE — ED Notes (Signed)
Per EMS: pt coming from home with c/o fall. Pt sat down on walker and slid onto floor landing on buttock. Pt stated a period of slight weakness. Pt was unable to get himself off the floor. Pt A&Ox4, respirations equal and unlabored, skin warm and dry, denies pain

## 2013-11-28 NOTE — ED Provider Notes (Signed)
CSN: [id number]     Arrival date & time 11/28/13  2202 History   First MD Initiated Contact with Patient 11/28/13 2308     Chief Complaint  Patient presents with  . Fall     (Consider location/radiation/quality/duration/timing/severity/associated sxs/prior Treatment) Patient is a 80 y.o. male presenting with fall. The history is provided by the patient.  Fall  He states that he this evening he tried to get out of his recliner and was unable to do so. His wife tried to help him out and he was unable to support himself and slid to the floor. He was able to get out of the recliner earlier today and did not feel any weaker than normal. He denies any pain anywhere. He denies fever, chills, sweats. He denies dyspnea. Denies cough, nausea, vomiting, diarrhea. He denies dysuria. There is no change in urinary frequency. He has not noticed any black or tarry bowel movements.  Past Medical History  Diagnosis Date  . Complete heart block 05/24/2005    Pacemaker (MDT) implanted by Dr Verlon Setting  . CAD (coronary artery disease)     s/p CABG  . PVD (peripheral vascular disease)     s/p remote bilateral femoral bypass at [facility], CE[facility] at [facility]  . HTN (hypertension)   . DM (diabetes mellitus)   . Peripheral autonomic neuropathy due to DM   . Venous insufficiency   . S/P AVR (aortic valve replacement)     mechanical valve  . A-fib     Permanent afib  . Hyperlipidemia    Past Surgical History  Procedure Laterality Date  . Pacemaker insertion  05/24/2005    MDT by Dr Verlon Setting  . Coronary artery bypass graft      at [facility]  . Carotid endarterectomy  06/25/10    at [facility]  . Femoral artery - femoral artery bypass graft      remote  . R carotid stent placement  1/13    at [facility]  . Pacemaker generator change  05/18/12    MDT Sherril Croon SR implanted by Dr Rayann Heman   Family History  Problem Relation Age of Onset  . Coronary artery disease     History  Substance Use Topics  . Smoking status: Former Research scientist (life sciences)   . Smokeless tobacco: Not on file  . Alcohol Use: No    Review of Systems  All other systems reviewed and are negative.     Allergies  Review of patient's allergies indicates no known allergies.  Home Medications   Prior to Admission medications   Medication Sig Start Date End Date Taking? Authorizing Provider  aspirin 81 MG tablet Take 81 mg by mouth daily.    Yes Historical Provider, MD  atorvastatin (LIPITOR) 40 MG tablet Take 40 mg by mouth at bedtime.     Yes Historical Provider, MD  bimatoprost (LUMIGAN) 0.03 % ophthalmic drops Place 1 drop into both eyes at bedtime.     Yes Historical Provider, MD  folic acid (FOLVITE) 1 MG tablet Take 1 mg by mouth daily.     Yes Historical Provider, MD  furosemide (LASIX) 40 MG tablet Take 40 mg by mouth daily.    Yes Historical Provider, MD  metFORMIN (GLUCOPHAGE) 500 MG tablet Take 1,000 mg by mouth daily.    Yes Historical Provider, MD  metoprolol (LOPRESSOR) 50 MG tablet Take 25 mg by mouth 2 (two) times daily.    Yes Historical Provider, MD  Multiple Vitamin (MULTIVITAMIN) tablet Take 1 tablet by mouth  daily.     Yes Historical Provider, MD  timolol (TIMOPTIC) 0.5 % ophthalmic solution Place 1 drop into both eyes daily. 11/24/13  Yes Historical Provider, MD  valsartan (DIOVAN) 160 MG tablet Take 160 mg by mouth daily.     Yes Historical Provider, MD  gabapentin (NEURONTIN) 300 MG capsule Take 300 mg by mouth daily as needed. For foot pain    Historical Provider, MD  traMADol (ULTRAM) 50 MG tablet Take 50 mg by mouth every 6 (six) hours as needed. For pain    Historical Provider, MD  vitamin D, CHOLECALCIFEROL, 400 UNITS tablet Take 400 Units by mouth daily.      Historical Provider, MD  warfarin (COUMADIN) 5 MG tablet Take as directed by coumadin clinic 09/28/13   Thompson Grayer, MD   BP 127/82  Pulse 66  Resp 22  SpO2 96% Physical Exam  Nursing note and vitals reviewed.  80 year old male, resting comfortably and in no acute  distress. Vital signs are normal. Oxygen saturation is 98%, which is normal. Head is normocephalic and atraumatic. PERRLA, EOMI. Oropharynx is clear. Neck is nontender and supple without adenopathy or JVD. Back is nontender and there is no CVA tenderness. Lungs are clear without rales, wheezes, or rhonchi. Chest is nontender. Heart has regular rate and rhythm without murmur. Abdomen is soft, flat, nontender without masses or hepatosplenomegaly and peristalsis is normoactive. Extremities have no cyanosis or edema, full range of motion is present. Skin is warm and dry without rash. Neurologic: Mental status is normal, cranial nerves are intact, there are no objective motor or sensory deficits.  ED Course  Procedures (including critical care time) Labs Review Results for orders placed during the hospital encounter of 11/28/13  CBC WITH DIFFERENTIAL      Result Value Ref Range   WBC 9.7  4.0 - 10.5 K/uL   RBC 4.82  4.22 - 5.81 MIL/uL   Hemoglobin 15.4  13.0 - 17.0 g/dL   HCT 44.8  39.0 - 52.0 %   MCV 92.9  78.0 - 100.0 fL   MCH 32.0  26.0 - 34.0 pg   MCHC 34.4  30.0 - 36.0 g/dL   RDW 13.7  11.5 - 15.5 %   Platelets 102 (*) 150 - 400 K/uL   Neutrophils Relative % 83 (*) 43 - 77 %   Neutro Abs 8.1 (*) 1.7 - 7.7 K/uL   Lymphocytes Relative 8 (*) 12 - 46 %   Lymphs Abs 0.8  0.7 - 4.0 K/uL   Monocytes Relative 8  3 - 12 %   Monocytes Absolute 0.8  0.1 - 1.0 K/uL   Eosinophils Relative 1  0 - 5 %   Eosinophils Absolute 0.1  0.0 - 0.7 K/uL   Basophils Relative 0  0 - 1 %   Basophils Absolute 0.0  0.0 - 0.1 K/uL  COMPREHENSIVE METABOLIC PANEL      Result Value Ref Range   Sodium 142  137 - 147 mEq/L   Potassium 4.2  3.7 - 5.3 mEq/L   Chloride 105  96 - 112 mEq/L   CO2 24  19 - 32 mEq/L   Glucose, Bld 154 (*) 70 - 99 mg/dL   BUN 17  6 - 23 mg/dL   Creatinine, Ser 0.89  0.50 - 1.35 mg/dL   Calcium 9.0  8.4 - 10.5 mg/dL   Total Protein 7.4  6.0 - 8.3 g/dL   Albumin 3.8  3.5 - 5.2  g/dL  AST 29  0 - 37 U/L   ALT 24  0 - 53 U/L   Alkaline Phosphatase 89  39 - 117 U/L   Total Bilirubin 2.1 (*) 0.3 - 1.2 mg/dL   GFR calc non Af Amer 78 (*) >90 mL/min   GFR calc Af Amer >90  >90 mL/min   Anion gap 13  5 - 15  URINALYSIS, ROUTINE W REFLEX MICROSCOPIC      Result Value Ref Range   Color, Urine YELLOW  YELLOW   APPearance CLEAR  CLEAR   Specific Gravity, Urine 1.015  1.005 - 1.030   pH 6.5  5.0 - 8.0   Glucose, UA NEGATIVE  NEGATIVE mg/dL   Hgb urine dipstick MODERATE (*) NEGATIVE   Bilirubin Urine NEGATIVE  NEGATIVE   Ketones, ur NEGATIVE  NEGATIVE mg/dL   Protein, ur 100 (*) NEGATIVE mg/dL   Urobilinogen, UA 1.0  0.0 - 1.0 mg/dL   Nitrite NEGATIVE  NEGATIVE   Leukocytes, UA NEGATIVE  NEGATIVE  CK      Result Value Ref Range   Total CK 147  7 - 232 U/L  PROTIME-INR      Result Value Ref Range   Prothrombin Time 22.9 (*) 11.6 - 15.2 seconds   INR 2.02 (*) 0.00 - 1.49  URINE MICROSCOPIC-ADD ON      Result Value Ref Range   Squamous Epithelial / LPF RARE  RARE   WBC, UA 0-2  <3 WBC/hpf   RBC / HPF 7-10  <3 RBC/hpf   Bacteria, UA RARE  RARE   Imaging Review Dg Chest Port 1 View  11/28/2013   CLINICAL DATA:  Weakness today.  No chest complaints.  EXAM: PORTABLE CHEST - 1 VIEW  COMPARISON:  11/20/2013  FINDINGS: Postoperative changes in the mediastinum. Cardiac pacemaker. Shallow inspiration. Mild cardiac enlargement without significant vascular congestion. Slightly increased density in the right perihilar region since previous study is probably due to patient rotation. No blunting of costophrenic angles. No pneumothorax. No focal consolidation.  IMPRESSION: Shallow inspiration. Mild cardiac enlargement. No evidence of active pulmonary disease.   Electronically Signed   By: Lucienne Capers M.D.   On: 11/28/2013 23:42    Date: 11/28/2013 2211  Rate: 70  Rhythm: Electronic ventricular pacemaker  QRS Axis: normal  Intervals: normal  ST/T Wave abnormalities:  normal  Conduction Disutrbances:none  Narrative Interpretation: Electronic ventricular pacing. When compared with ECG of 09/16/2011, no significant changes are seen.  Old EKG Reviewed: unchanged   Date: 11/28/2013 2304  Rate: 70  Rhythm: Electronic ventricular pacemaker  QRS Axis: normal  Intervals: normal  ST/T Wave abnormalities: normal  Conduction Disutrbances:none  Narrative Interpretation: Electronic ventricular pacing, no change from earlier tonight.  Old EKG Reviewed: unchanged   MDM   Final diagnoses:  Weakness  Elevated bilirubin    Weakness of uncertain cause. Electrolytes will be checked as well as urinalysis to look for "UTI. Chest x-ray or be obtained to look for a cold pneumonia. CK will be checked although he is not on any medication which it is commonly associated with rhabdomyolysis.  Workup is unremarkable. There is mild elevation of bilirubin which is unchanged significantly from baseline. Patient was ambulated and he was able to ambulate at his baseline level and his discharge. Reason for her transient episode of weakness is unclear, but he is advised to follow up with PCP and return should symptoms worsen.  Delora Fuel, MD 46/96/29 [id number]

## 2013-11-29 DIAGNOSIS — Z043 Encounter for examination and observation following other accident: Secondary | ICD-10-CM | POA: Diagnosis not present

## 2013-11-29 LAB — URINALYSIS, ROUTINE W REFLEX MICROSCOPIC
Bilirubin Urine: NEGATIVE
GLUCOSE, UA: NEGATIVE mg/dL
KETONES UR: NEGATIVE mg/dL
LEUKOCYTES UA: NEGATIVE
Nitrite: NEGATIVE
Protein, ur: 100 mg/dL — AB
Specific Gravity, Urine: 1.015 (ref 1.005–1.030)
UROBILINOGEN UA: 1 mg/dL (ref 0.0–1.0)
pH: 6.5 (ref 5.0–8.0)

## 2013-11-29 LAB — COMPREHENSIVE METABOLIC PANEL
ALT: 24 U/L (ref 0–53)
ANION GAP: 13 (ref 5–15)
AST: 29 U/L (ref 0–37)
Albumin: 3.8 g/dL (ref 3.5–5.2)
Alkaline Phosphatase: 89 U/L (ref 39–117)
BUN: 17 mg/dL (ref 6–23)
CALCIUM: 9 mg/dL (ref 8.4–10.5)
CO2: 24 mEq/L (ref 19–32)
Chloride: 105 mEq/L (ref 96–112)
Creatinine, Ser: 0.89 mg/dL (ref 0.50–1.35)
GFR calc Af Amer: >90 mL/min (ref >90)
GFR, EST NON AFRICAN AMERICAN: 78 mL/min — AB (ref >90)
Glucose, Bld: 154 mg/dL — ABNORMAL HIGH (ref 70–99)
Potassium: 4.2 mEq/L (ref 3.7–5.3)
Sodium: 142 mEq/L (ref 137–147)
Total Bilirubin: 2.1 mg/dL — ABNORMAL HIGH (ref 0.3–1.2)
Total Protein: 7.4 g/dL (ref 6.0–8.3)

## 2013-11-29 LAB — CBC WITH DIFFERENTIAL/PLATELET
BASOS ABS: 0 10*3/uL (ref 0.0–0.1)
BASOS PCT: 0 % (ref 0–1)
EOS PCT: 1 % (ref 0–5)
Eosinophils Absolute: 0.1 10*3/uL (ref 0.0–0.7)
HEMATOCRIT: 44.8 % (ref 39.0–52.0)
HEMOGLOBIN: 15.4 g/dL (ref 13.0–17.0)
Lymphocytes Relative: 8 % — ABNORMAL LOW (ref 12–46)
Lymphs Abs: 0.8 10*3/uL (ref 0.7–4.0)
MCH: 32 pg (ref 26.0–34.0)
MCHC: 34.4 g/dL (ref 30.0–36.0)
MCV: 92.9 fL (ref 78.0–100.0)
MONO ABS: 0.8 10*3/uL (ref 0.1–1.0)
MONOS PCT: 8 % (ref 3–12)
Neutro Abs: 8.1 10*3/uL — ABNORMAL HIGH (ref 1.7–7.7)
Neutrophils Relative %: 83 % — ABNORMAL HIGH (ref 43–77)
Platelets: 102 10*3/uL — ABNORMAL LOW (ref 150–400)
RBC: 4.82 MIL/uL (ref 4.22–5.81)
RDW: 13.7 % (ref 11.5–15.5)
WBC: 9.7 10*3/uL (ref 4.0–10.5)

## 2013-11-29 LAB — URINE MICROSCOPIC-ADD ON

## 2013-11-29 LAB — CK: Total CK: 147 U/L (ref 7–232)

## 2013-11-29 NOTE — Discharge Instructions (Signed)

## 2013-12-03 ENCOUNTER — Ambulatory Visit (INDEPENDENT_AMBULATORY_CARE_PROVIDER_SITE_OTHER): Payer: Medicare Other | Admitting: Pharmacist

## 2013-12-03 DIAGNOSIS — I4891 Unspecified atrial fibrillation: Secondary | ICD-10-CM

## 2013-12-03 LAB — POCT INR: INR: 2.2

## 2013-12-08 ENCOUNTER — Other Ambulatory Visit: Payer: Self-pay | Admitting: Internal Medicine

## 2014-01-17 DIAGNOSIS — Z23 Encounter for immunization: Secondary | ICD-10-CM | POA: Diagnosis not present

## 2014-01-28 DIAGNOSIS — I6529 Occlusion and stenosis of unspecified carotid artery: Secondary | ICD-10-CM | POA: Diagnosis not present

## 2014-02-15 DIAGNOSIS — Z125 Encounter for screening for malignant neoplasm of prostate: Secondary | ICD-10-CM | POA: Diagnosis not present

## 2014-02-15 DIAGNOSIS — I10 Essential (primary) hypertension: Secondary | ICD-10-CM | POA: Diagnosis not present

## 2014-02-15 DIAGNOSIS — E119 Type 2 diabetes mellitus without complications: Secondary | ICD-10-CM | POA: Diagnosis not present

## 2014-02-20 ENCOUNTER — Other Ambulatory Visit: Payer: Self-pay | Admitting: Internal Medicine

## 2014-02-20 DIAGNOSIS — R7402 Elevation of levels of lactic acid dehydrogenase (LDH): Secondary | ICD-10-CM

## 2014-02-20 DIAGNOSIS — R740 Nonspecific elevation of levels of transaminase and lactic acid dehydrogenase [LDH]: Principal | ICD-10-CM

## 2014-02-28 ENCOUNTER — Ambulatory Visit
Admission: RE | Admit: 2014-02-28 | Discharge: 2014-02-28 | Disposition: A | Discharge status: Home / Self Care | Payer: Medicare Other | Source: Ambulatory Visit | Attending: Internal Medicine | Admitting: Internal Medicine

## 2014-02-28 DIAGNOSIS — K8020 Calculus of gallbladder without cholecystitis without obstruction: Secondary | ICD-10-CM | POA: Diagnosis not present

## 2014-02-28 DIAGNOSIS — N281 Cyst of kidney, acquired: Secondary | ICD-10-CM | POA: Diagnosis not present

## 2014-02-28 DIAGNOSIS — R740 Nonspecific elevation of levels of transaminase and lactic acid dehydrogenase [LDH]: Principal | ICD-10-CM

## 2014-02-28 DIAGNOSIS — R7402 Elevation of levels of lactic acid dehydrogenase (LDH): Secondary | ICD-10-CM

## 2014-03-05 ENCOUNTER — Ambulatory Visit (INDEPENDENT_AMBULATORY_CARE_PROVIDER_SITE_OTHER): Payer: Medicare Other

## 2014-03-05 DIAGNOSIS — I4891 Unspecified atrial fibrillation: Secondary | ICD-10-CM | POA: Diagnosis not present

## 2014-03-05 LAB — POCT INR: INR: 2.2

## 2014-03-05 MED ORDER — WARFARIN SODIUM 5 MG PO TABS: ORAL_TABLET | ORAL | Status: DC

## 2014-03-12 DIAGNOSIS — I10 Essential (primary) hypertension: Secondary | ICD-10-CM | POA: Diagnosis not present

## 2014-03-12 DIAGNOSIS — E118 Type 2 diabetes mellitus with unspecified complications: Secondary | ICD-10-CM | POA: Diagnosis not present

## 2014-03-12 DIAGNOSIS — R740 Nonspecific elevation of levels of transaminase and lactic acid dehydrogenase [LDH]: Secondary | ICD-10-CM | POA: Diagnosis not present

## 2014-03-12 DIAGNOSIS — E780 Pure hypercholesterolemia: Secondary | ICD-10-CM | POA: Diagnosis not present

## 2014-03-22 ENCOUNTER — Other Ambulatory Visit: Payer: Self-pay | Admitting: Internal Medicine

## 2014-04-11 ENCOUNTER — Encounter (HOSPITAL_COMMUNITY): Payer: Self-pay | Admitting: Internal Medicine

## 2014-04-15 DIAGNOSIS — H01004 Unspecified blepharitis left upper eyelid: Secondary | ICD-10-CM | POA: Diagnosis not present

## 2014-04-15 DIAGNOSIS — H4011X2 Primary open-angle glaucoma, moderate stage: Secondary | ICD-10-CM | POA: Diagnosis not present

## 2014-04-15 DIAGNOSIS — H01001 Unspecified blepharitis right upper eyelid: Secondary | ICD-10-CM | POA: Diagnosis not present

## 2014-04-15 DIAGNOSIS — H2513 Age-related nuclear cataract, bilateral: Secondary | ICD-10-CM | POA: Diagnosis not present

## 2014-06-06 ENCOUNTER — Encounter: Payer: Self-pay | Admitting: Cardiology

## 2014-06-06 DIAGNOSIS — I10 Essential (primary) hypertension: Secondary | ICD-10-CM | POA: Diagnosis not present

## 2014-06-06 DIAGNOSIS — E119 Type 2 diabetes mellitus without complications: Secondary | ICD-10-CM | POA: Diagnosis not present

## 2014-06-12 DIAGNOSIS — I6529 Occlusion and stenosis of unspecified carotid artery: Secondary | ICD-10-CM | POA: Diagnosis not present

## 2014-06-12 DIAGNOSIS — N281 Cyst of kidney, acquired: Secondary | ICD-10-CM | POA: Diagnosis not present

## 2014-06-12 DIAGNOSIS — S8992XA Unspecified injury of left lower leg, initial encounter: Secondary | ICD-10-CM | POA: Diagnosis not present

## 2014-06-12 DIAGNOSIS — R413 Other amnesia: Secondary | ICD-10-CM | POA: Diagnosis not present

## 2014-06-12 DIAGNOSIS — M25562 Pain in left knee: Secondary | ICD-10-CM | POA: Diagnosis not present

## 2014-06-14 ENCOUNTER — Encounter: Payer: Self-pay | Admitting: Surgery

## 2014-06-14 ENCOUNTER — Other Ambulatory Visit: Payer: Self-pay | Admitting: *Deleted

## 2014-06-14 DIAGNOSIS — I6529 Occlusion and stenosis of unspecified carotid artery: Secondary | ICD-10-CM

## 2014-07-12 ENCOUNTER — Encounter: Payer: Self-pay | Admitting: Surgery

## 2014-07-15 ENCOUNTER — Ambulatory Visit (HOSPITAL_COMMUNITY)
Admission: RE | Admit: 2014-07-15 | Discharge: 2014-07-15 | Disposition: A | Discharge status: Home / Self Care | Payer: Medicare Other | Source: Ambulatory Visit | Attending: Surgery | Admitting: Surgery

## 2014-07-15 ENCOUNTER — Ambulatory Visit (INDEPENDENT_AMBULATORY_CARE_PROVIDER_SITE_OTHER): Payer: Medicare Other | Admitting: Surgery

## 2014-07-15 ENCOUNTER — Encounter: Payer: Self-pay | Admitting: Surgery

## 2014-07-15 VITALS — BP 121/62 | HR 79 | Temp 97.8°F | Resp 20 | Ht 71.0 in | Wt 182.5 lb

## 2014-07-15 DIAGNOSIS — I6529 Occlusion and stenosis of unspecified carotid artery: Secondary | ICD-10-CM

## 2014-07-15 DIAGNOSIS — Z48812 Encounter for surgical aftercare following surgery on the circulatory system: Secondary | ICD-10-CM

## 2014-07-15 DIAGNOSIS — I6523 Occlusion and stenosis of bilateral carotid arteries: Secondary | ICD-10-CM | POA: Diagnosis not present

## 2014-07-15 NOTE — Addendum Note (Signed)
Addended by: Dorthula Rue L on: 07/15/2014 03:25 PM   Modules accepted: Orders

## 2014-07-15 NOTE — Progress Notes (Signed)
Patient name: Jeffrey Whitney MRN: [id number] DOB: 06-19-1930 Sex: male   Referred by: Dr. Maudie Mercury  Reason for referral:  Chief Complaint  Patient presents with  . New Evaluation    ref. by Dr. Maudie Mercury; carotid stenosis; hx right CEA 2012 @ [facility].     HISTORY OF PRESENT ILLNESS: This is an 81 year old gentleman who is sent over here for reestablishing vascular care.  The patient has a significant carotid artery history, all done at [facility].  He is status post carotid endarterectomy, and most recently carotid stenting in 2012.  Per the patient, these were all 4 asymptomatic lesions.  He remains asymptomatic.  Today, he denies numbness or weakness in either extremity.  He denies third speech.  He denies amaurosis fugax.  The patient has a significant coronary history.  He has undergone CABG in 1983 and 1996.  He has also had a porcine aortic valve replaced at [facility].  A permanent pacemaker has been implanted for complete heart block.  He is anticoagulated for atrial fibrillation.  The patient is a diabetic which is been complicated by neuropathy.  His hypercholesterolemia is managed with a statin.  Past Medical History  Diagnosis Date  . Complete heart block 05/24/2005    Pacemaker (MDT) implanted by Dr Verlon Setting  . CAD (coronary artery disease)     s/p CABG  . PVD (peripheral vascular disease)     s/p remote bilateral femoral bypass at [facility], CEA at [facility]  . HTN (hypertension)   . DM (diabetes mellitus)   . Peripheral autonomic neuropathy due to DM   . Venous insufficiency   . S/P AVR (aortic valve replacement)     mechanical valve  . A-fib     Permanent afib  . Hyperlipidemia     Past Surgical History  Procedure Laterality Date  . Pacemaker insertion  05/24/2005    MDT by Dr Verlon Setting  . Coronary artery bypass graft      at [facility]  . Carotid endarterectomy  06/25/10    at [facility]  . Femoral artery - femoral artery bypass graft      remote  . R carotid stent  placement  1/13    at [facility]  . Pacemaker generator change  05/18/12    MDT Sherril Croon SR implanted by Dr Rayann Heman  . Pacemaker generator change N/A 05/18/2012    Procedure: PACEMAKER GENERATOR CHANGE;  Surgeon: Thompson Grayer, MD;  Location: [facility] CATH LAB;  Service: Cardiovascular;  Laterality: N/A;    History   Social History  . Marital Status: Married    Spouse Name: N/A  . Number of Children: N/A  . Years of Education: N/A   Occupational History  . Not on file.   Social History Main Topics  . Smoking status: Former Smoker -- 20 years    Types: Cigarettes    Quit date: 07/15/1994  . Smokeless tobacco: Not on file  . Alcohol Use: No  . Drug Use: No  . Sexual Activity: Not on file   Other Topics Concern  . Not on file   Social History Narrative    Family History  Problem Relation Age of Onset  . Coronary artery disease    . Pulmonary embolism Mother   . Diabetes Brother     Allergies as of 07/15/2014  . (No Known Allergies)    Current Outpatient Prescriptions on File Prior to Visit  Medication Sig Dispense Refill  . acetaminophen (TYLENOL) 500 MG tablet Take  500 mg by mouth every 6 (six) hours as needed for moderate pain.    Marland Kitchen aspirin 81 MG tablet Take 81 mg by mouth daily.     Marland Kitchen atorvastatin (LIPITOR) 40 MG tablet Take 40 mg by mouth at bedtime.      . bimatoprost (LUMIGAN) 0.03 % ophthalmic drops Place 1 drop into both eyes at bedtime.      . folic acid (FOLVITE) 1 MG tablet Take 1 mg by mouth daily.      . furosemide (LASIX) 40 MG tablet Take 40 mg by mouth daily.     . metFORMIN (GLUCOPHAGE) 500 MG tablet Take 1,000 mg by mouth daily.     . metoprolol (LOPRESSOR) 50 MG tablet Take 25 mg by mouth 2 (two) times daily.     . Multiple Vitamin (MULTIVITAMIN) tablet Take 1 tablet by mouth daily.      Marland Kitchen PRESCRIPTION MEDICATION Apply 1 application topically 2 (two) times daily. For foot pain-compounded from specialty pharmacy.    Marland Kitchen timolol (TIMOPTIC) 0.5 % ophthalmic  solution Place 1 drop into both eyes daily.    . traMADol (ULTRAM) 50 MG tablet Take 50 mg by mouth every 6 (six) hours as needed. For pain    . valsartan (DIOVAN) 160 MG tablet Take 160 mg by mouth daily.      . vitamin D, CHOLECALCIFEROL, 400 UNITS tablet Take 400 Units by mouth daily.      Marland Kitchen warfarin (COUMADIN) 5 MG tablet Take as directed by Coumadin clinic 30 tablet 1  . warfarin (COUMADIN) 5 MG tablet TAKE AS DIRECTED BY COUMADIN CLINIC 40 tablet 3   No current facility-administered medications on file prior to visit.     REVIEW OF SYSTEMS: Cardiovascular: No chest pain, chest pressure, palpitations, orthopnea, or dyspnea on exertion. No claudication or rest pain,  No history of DVT or phlebitis. Pulmonary: No productive cough, asthma or wheezing. Neurologic: No weakness, paresthesias, aphasia, or amaurosis. No dizziness. Hematologic: No bleeding problems or clotting disorders. Musculoskeletal: No joint pain or joint swelling. Gastrointestinal: No blood in stool or hematemesis Genitourinary: No dysuria or hematuria. Psychiatric:: No history of major depression. Integumentary: No rashes or ulcers. Constitutional: No fever or chills.  PHYSICAL EXAMINATION: General: The patient appears their stated age.  Vital signs are BP 121/62 mmHg  Pulse 79  Temp(Src) 97.8 F (36.6 C) (Oral)  Resp 20  Ht 5\' 11"  (1.803 m)  Wt 182 lb 8 oz (82.781 kg)  BMI 25.46 kg/m2 HEENT:  No gross abnormalities Pulmonary: Respirations are non-labored Abdomen: Soft and non-tender.  Aorta appears non--aneurysmal  Musculoskeletal: There are no major deformities.   Neurologic: No focal weakness or paresthesias are detected, Skin: There are no ulcer or rashes noted. Psychiatric: The patient has normal affect. Cardiovascular: There is a regular rate and rhythm without significant murmur appreciated.  Palpable pedal pulses.  No carotid bruits  Diagnostic Studies: I have ordered and reviewed his carotid  ultrasound.  This shows a widely patent right internal carotid stent without evidence of restenosis.  The left carotid artery has velocities suggesting a stenosis less than 40%  Assessment:  Carotid stenosis Plan: I reviewed the ultrasound with the patient and his wife today.  He will need annual surveillance.  Currently, his carotid is widely patent.  I will plan on having him back with a repeat carotid ultrasound in one year     V. Leia Alf, M.D. [facility] Office: [phone] Pager:  [phone]   

## 2014-09-05 DIAGNOSIS — R413 Other amnesia: Secondary | ICD-10-CM | POA: Diagnosis not present

## 2014-09-05 DIAGNOSIS — Z125 Encounter for screening for malignant neoplasm of prostate: Secondary | ICD-10-CM | POA: Diagnosis not present

## 2014-09-05 DIAGNOSIS — I10 Essential (primary) hypertension: Secondary | ICD-10-CM | POA: Diagnosis not present

## 2014-09-05 DIAGNOSIS — E119 Type 2 diabetes mellitus without complications: Secondary | ICD-10-CM | POA: Diagnosis not present

## 2014-09-11 DIAGNOSIS — E119 Type 2 diabetes mellitus without complications: Secondary | ICD-10-CM | POA: Diagnosis not present

## 2014-09-11 DIAGNOSIS — R634 Abnormal weight loss: Secondary | ICD-10-CM | POA: Diagnosis not present

## 2014-09-11 DIAGNOSIS — R413 Other amnesia: Secondary | ICD-10-CM | POA: Diagnosis not present

## 2014-09-11 DIAGNOSIS — I10 Essential (primary) hypertension: Secondary | ICD-10-CM | POA: Diagnosis not present

## 2014-10-04 ENCOUNTER — Encounter: Payer: Self-pay | Admitting: *Deleted

## 2014-10-09 DIAGNOSIS — R634 Abnormal weight loss: Secondary | ICD-10-CM | POA: Diagnosis not present

## 2014-10-09 DIAGNOSIS — E780 Pure hypercholesterolemia: Secondary | ICD-10-CM | POA: Diagnosis not present

## 2014-10-09 DIAGNOSIS — E119 Type 2 diabetes mellitus without complications: Secondary | ICD-10-CM | POA: Diagnosis not present

## 2014-10-09 DIAGNOSIS — I10 Essential (primary) hypertension: Secondary | ICD-10-CM | POA: Diagnosis not present

## 2014-10-11 DIAGNOSIS — H5340 Unspecified visual field defects: Secondary | ICD-10-CM | POA: Diagnosis not present

## 2014-10-11 DIAGNOSIS — H25813 Combined forms of age-related cataract, bilateral: Secondary | ICD-10-CM | POA: Diagnosis not present

## 2014-10-11 DIAGNOSIS — H4011X2 Primary open-angle glaucoma, moderate stage: Secondary | ICD-10-CM | POA: Diagnosis not present

## 2014-10-30 ENCOUNTER — Other Ambulatory Visit: Payer: Self-pay | Admitting: Internal Medicine

## 2014-10-30 DIAGNOSIS — R413 Other amnesia: Secondary | ICD-10-CM | POA: Diagnosis not present

## 2014-10-30 DIAGNOSIS — E119 Type 2 diabetes mellitus without complications: Secondary | ICD-10-CM | POA: Diagnosis not present

## 2014-10-30 DIAGNOSIS — I10 Essential (primary) hypertension: Secondary | ICD-10-CM | POA: Diagnosis not present

## 2014-10-30 DIAGNOSIS — E780 Pure hypercholesterolemia: Secondary | ICD-10-CM | POA: Diagnosis not present

## 2014-10-31 ENCOUNTER — Other Ambulatory Visit: Payer: Self-pay | Admitting: Internal Medicine

## 2014-10-31 DIAGNOSIS — R413 Other amnesia: Secondary | ICD-10-CM

## 2014-11-01 ENCOUNTER — Encounter: Payer: Self-pay | Admitting: *Deleted

## 2014-11-08 ENCOUNTER — Ambulatory Visit
Admission: RE | Admit: 2014-11-08 | Discharge: 2014-11-08 | Disposition: A | Discharge status: Home / Self Care | Payer: Medicare Other | Source: Ambulatory Visit | Attending: Internal Medicine | Admitting: Internal Medicine

## 2014-11-08 DIAGNOSIS — R413 Other amnesia: Secondary | ICD-10-CM | POA: Diagnosis not present

## 2014-11-08 MED ORDER — IOPAMIDOL (ISOVUE-300) INJECTION 61%
75.0000 mL | Freq: Once | INTRAVENOUS | Status: AC | PRN
  Administered 2014-11-08: 75 mL via INTRAVENOUS

## 2014-11-21 DIAGNOSIS — H2513 Age-related nuclear cataract, bilateral: Secondary | ICD-10-CM | POA: Diagnosis not present

## 2014-11-21 DIAGNOSIS — H4011X2 Primary open-angle glaucoma, moderate stage: Secondary | ICD-10-CM | POA: Diagnosis not present

## 2014-11-22 ENCOUNTER — Other Ambulatory Visit: Payer: Self-pay | Admitting: Internal Medicine

## 2014-11-26 ENCOUNTER — Encounter: Payer: Self-pay | Admitting: *Deleted

## 2014-11-29 ENCOUNTER — Ambulatory Visit (INDEPENDENT_AMBULATORY_CARE_PROVIDER_SITE_OTHER): Payer: Medicare Other

## 2014-11-29 DIAGNOSIS — I4891 Unspecified atrial fibrillation: Secondary | ICD-10-CM | POA: Diagnosis not present

## 2014-11-29 LAB — POCT INR: INR: 1.4

## 2014-12-04 DIAGNOSIS — H4011X2 Primary open-angle glaucoma, moderate stage: Secondary | ICD-10-CM | POA: Diagnosis not present

## 2014-12-10 ENCOUNTER — Ambulatory Visit (INDEPENDENT_AMBULATORY_CARE_PROVIDER_SITE_OTHER): Payer: Medicare Other

## 2014-12-10 DIAGNOSIS — I4891 Unspecified atrial fibrillation: Secondary | ICD-10-CM

## 2014-12-10 LAB — POCT INR: INR: 1.1

## 2014-12-11 DIAGNOSIS — I10 Essential (primary) hypertension: Secondary | ICD-10-CM | POA: Diagnosis not present

## 2014-12-11 DIAGNOSIS — E119 Type 2 diabetes mellitus without complications: Secondary | ICD-10-CM | POA: Diagnosis not present

## 2014-12-11 DIAGNOSIS — H4011X2 Primary open-angle glaucoma, moderate stage: Secondary | ICD-10-CM | POA: Diagnosis not present

## 2014-12-16 DIAGNOSIS — I480 Paroxysmal atrial fibrillation: Secondary | ICD-10-CM | POA: Diagnosis not present

## 2014-12-16 DIAGNOSIS — E119 Type 2 diabetes mellitus without complications: Secondary | ICD-10-CM | POA: Diagnosis not present

## 2014-12-16 DIAGNOSIS — I10 Essential (primary) hypertension: Secondary | ICD-10-CM | POA: Diagnosis not present

## 2014-12-16 DIAGNOSIS — I2510 Atherosclerotic heart disease of native coronary artery without angina pectoris: Secondary | ICD-10-CM | POA: Diagnosis not present

## 2014-12-17 ENCOUNTER — Ambulatory Visit (INDEPENDENT_AMBULATORY_CARE_PROVIDER_SITE_OTHER): Payer: Medicare Other | Admitting: Pharmacist

## 2014-12-17 DIAGNOSIS — I4891 Unspecified atrial fibrillation: Secondary | ICD-10-CM | POA: Diagnosis not present

## 2014-12-17 LAB — POCT INR: INR: 1.1

## 2014-12-24 ENCOUNTER — Ambulatory Visit (INDEPENDENT_AMBULATORY_CARE_PROVIDER_SITE_OTHER): Payer: Medicare Other

## 2014-12-24 DIAGNOSIS — I4891 Unspecified atrial fibrillation: Secondary | ICD-10-CM

## 2014-12-24 LAB — POCT INR: INR: 1.4

## 2014-12-30 ENCOUNTER — Ambulatory Visit (INDEPENDENT_AMBULATORY_CARE_PROVIDER_SITE_OTHER): Payer: Medicare Other | Admitting: Internal Medicine

## 2014-12-30 ENCOUNTER — Ambulatory Visit (INDEPENDENT_AMBULATORY_CARE_PROVIDER_SITE_OTHER): Payer: Medicare Other

## 2014-12-30 ENCOUNTER — Encounter: Payer: Self-pay | Admitting: Internal Medicine

## 2014-12-30 ENCOUNTER — Other Ambulatory Visit: Payer: Self-pay

## 2014-12-30 VITALS — BP 136/70 | HR 85 | Ht 72.0 in | Wt 177.0 lb

## 2014-12-30 DIAGNOSIS — I442 Atrioventricular block, complete: Secondary | ICD-10-CM | POA: Diagnosis not present

## 2014-12-30 DIAGNOSIS — I6523 Occlusion and stenosis of bilateral carotid arteries: Secondary | ICD-10-CM

## 2014-12-30 DIAGNOSIS — I4891 Unspecified atrial fibrillation: Secondary | ICD-10-CM

## 2014-12-30 DIAGNOSIS — I482 Chronic atrial fibrillation, unspecified: Secondary | ICD-10-CM

## 2014-12-30 LAB — CUP PACEART INCLINIC DEVICE CHECK
Battery Remaining Longevity: 84 mo
Brady Statistic RV Percent Paced: 98 %
Date Time Interrogation Session: 20160829101732
Lead Channel Impedance Value: 0 Ohm
Lead Channel Impedance Value: 607 Ohm
Lead Channel Pacing Threshold Amplitude: 0.75 V
Lead Channel Pacing Threshold Pulse Width: 0.4 ms
Lead Channel Setting Pacing Amplitude: 2.5 V
Lead Channel Setting Pacing Pulse Width: 0.4 ms
MDC IDC MSMT BATTERY IMPEDANCE: 405 Ohm
MDC IDC MSMT BATTERY VOLTAGE: 2.79 V
MDC IDC SET LEADCHNL RV SENSING SENSITIVITY: 5.6 mV

## 2014-12-30 LAB — POCT INR: INR: 6

## 2014-12-30 NOTE — Progress Notes (Signed)
PCP: Jani Gravel, MD Primary Cardiologist:  Dr Fayrene Fearing is a 81 y.o. male who presents today for routine electrophysiology followup.  Since his last visit to see me, the patient reports doing very well.   He is not very active. Today, he denies symptoms of palpitations, chest pain, shortness of breath,  lower extremity edema, dizziness, presyncope, or syncope.  The patient is otherwise without complaint today.   Past Medical History  Diagnosis Date  . Complete heart block 05/24/2005    Pacemaker (MDT) implanted by Dr Verlon Setting  . CAD (coronary artery disease)     s/p CABG  . PVD (peripheral vascular disease)     s/p remote bilateral femoral bypass at [facility], [facility] at [facility]  . HTN (hypertension)   . DM (diabetes mellitus)   . Peripheral autonomic neuropathy due to DM   . Venous insufficiency   . S/P AVR (aortic valve replacement)     mechanical valve  . A-fib     Permanent afib  . Hyperlipidemia    Past Surgical History  Procedure Laterality Date  . Pacemaker insertion  05/24/2005    MDT by Dr Verlon Setting  . Coronary artery bypass graft      at [facility]  . Carotid endarterectomy  06/25/10    at [facility]  . Femoral artery - femoral artery bypass graft      remote  . R carotid stent placement  1/13    at [facility]  . Pacemaker generator change  05/18/12    MDT Sherril Croon SR implanted by Dr Rayann Heman  . Pacemaker generator change N/A 05/18/2012    Procedure: PACEMAKER GENERATOR CHANGE;  Surgeon: Thompson Grayer, MD;  Location: [facility] CATH LAB;  Service: Cardiovascular;  Laterality: N/A;    Current Outpatient Prescriptions  Medication Sig Dispense Refill  . acetaminophen (TYLENOL) 500 MG tablet Take 500 mg by mouth every 6 (six) hours as needed for moderate pain.    Marland Kitchen aspirin 81 MG tablet Take 81 mg by mouth daily.     Marland Kitchen atorvastatin (LIPITOR) 40 MG tablet Take 40 mg by mouth at bedtime.      . bimatoprost (LUMIGAN) 0.03 % ophthalmic drops Place 1 drop into both eyes at bedtime.      .  donepezil (ARICEPT) 5 MG tablet Take 5 mg by mouth at bedtime.    . folic acid (FOLVITE) 1 MG tablet Take 1 mg by mouth daily.      . furosemide (LASIX) 40 MG tablet Take 40 mg by mouth daily.     . metFORMIN (GLUCOPHAGE) 500 MG tablet Take 1,000 mg by mouth daily.     . metoprolol (LOPRESSOR) 50 MG tablet Take 25 mg by mouth 2 (two) times daily.     . Multiple Vitamin (MULTIVITAMIN) tablet Take 1 tablet by mouth daily.      . QUEtiapine Fumarate (SEROQUEL PO) Take 1 capsule by mouth daily. Pt unsure of strength    . timolol (TIMOPTIC) 0.5 % ophthalmic solution Place 1 drop into both eyes daily.    . traMADol (ULTRAM) 50 MG tablet Take 50 mg by mouth every 6 (six) hours as needed. For pain    . valsartan (DIOVAN) 160 MG tablet Take 160 mg by mouth daily.      . vitamin D, CHOLECALCIFEROL, 400 UNITS tablet Take 400 Units by mouth daily.      Marland Kitchen warfarin (COUMADIN) 5 MG tablet TAKE AS DIRECTED BY COUMADIN CLINIC 40 tablet 1  No current facility-administered medications for this visit.    Physical Exam: Filed Vitals:   12/30/14 0954  BP: 136/70  Pulse: 85  Height: 6' (1.829 m)  Weight: 80.287 kg (177 lb)    GEN- The patient is well appearing, alert and oriented x 3 today.   Head- normocephalic, atraumatic Eyes-  Sclera clear, conjunctiva pink Ears- hearing intact Oropharynx- clear Lungs- Clear to ausculation bilaterally, normal work of breathing Chest- pacemaker pocket is well healed Heart- Regular rate and rhythm (paced), aortic valve closure is not very crisp GI- soft, NT, ND, + BS Extremities- no clubbing, cyanosis, +1 edema  Pacemaker interrogation- reviewed in detail today,  See PACEART report  Assessment and Plan:  1. Complete heart block Normal pacemaker function See Pace Art report No changes today  2. Permanent afib Rate controlled INR followed in the coumadin clinic  3. Valvular heart disease Follow-up with Dr Mare Ferrari is due  Follow-up with Dr Mare Ferrari at  next available Follow-up closely with coumadin clinic Return in 1 year to see EP NP in the device clinic Not able to comply with carelink

## 2014-12-30 NOTE — Patient Instructions (Signed)
Medication Instructions:  Your physician recommends that you continue on your current medications as directed. Please refer to the Current Medication list given to you today.   Labwork: None ordered  Testing/Procedures: None ordered  Follow-Up: Your physician recommends that you schedule a follow-up appointment in: next few months with Dr Mare Ferrari  Your physician wants you to follow-up in: 12 months with Chanetta Marshall, NP You will receive a reminder letter in the mail two months in advance. If you don't receive a letter, please call our office to schedule the follow-up appointment.    Any Other Special Instructions Will Be Listed Below (If Applicable).

## 2015-01-07 ENCOUNTER — Encounter: Payer: Self-pay | Admitting: Internal Medicine

## 2015-01-07 ENCOUNTER — Ambulatory Visit (INDEPENDENT_AMBULATORY_CARE_PROVIDER_SITE_OTHER): Payer: Medicare Other | Admitting: *Deleted

## 2015-01-07 DIAGNOSIS — I4891 Unspecified atrial fibrillation: Secondary | ICD-10-CM

## 2015-01-07 DIAGNOSIS — I482 Chronic atrial fibrillation, unspecified: Secondary | ICD-10-CM

## 2015-01-07 LAB — PROTIME-INR
INR: 10 ratio (ref 0.8–1.0)
Prothrombin Time: 103.5 s (ref 9.6–13.1)

## 2015-01-07 LAB — POCT INR: INR: 8

## 2015-01-07 MED ORDER — PHYTONADIONE 5 MG PO TABS: ORAL_TABLET | ORAL | Status: DC

## 2015-01-10 ENCOUNTER — Ambulatory Visit (INDEPENDENT_AMBULATORY_CARE_PROVIDER_SITE_OTHER): Payer: Medicare Other | Admitting: Pharmacist

## 2015-01-10 DIAGNOSIS — I482 Chronic atrial fibrillation, unspecified: Secondary | ICD-10-CM

## 2015-01-10 DIAGNOSIS — I4891 Unspecified atrial fibrillation: Secondary | ICD-10-CM

## 2015-01-10 LAB — POCT INR: INR: 2.2

## 2015-01-17 ENCOUNTER — Ambulatory Visit (INDEPENDENT_AMBULATORY_CARE_PROVIDER_SITE_OTHER): Payer: Medicare Other | Admitting: Pharmacist

## 2015-01-17 DIAGNOSIS — I482 Chronic atrial fibrillation, unspecified: Secondary | ICD-10-CM

## 2015-01-17 DIAGNOSIS — I4891 Unspecified atrial fibrillation: Secondary | ICD-10-CM | POA: Diagnosis not present

## 2015-01-17 LAB — POCT INR: INR: 1.8

## 2015-01-22 DIAGNOSIS — H01004 Unspecified blepharitis left upper eyelid: Secondary | ICD-10-CM | POA: Diagnosis not present

## 2015-01-22 DIAGNOSIS — H01001 Unspecified blepharitis right upper eyelid: Secondary | ICD-10-CM | POA: Diagnosis not present

## 2015-01-24 ENCOUNTER — Ambulatory Visit (INDEPENDENT_AMBULATORY_CARE_PROVIDER_SITE_OTHER): Payer: Medicare Other

## 2015-01-24 DIAGNOSIS — I482 Chronic atrial fibrillation, unspecified: Secondary | ICD-10-CM

## 2015-01-24 DIAGNOSIS — I4891 Unspecified atrial fibrillation: Secondary | ICD-10-CM | POA: Diagnosis not present

## 2015-01-24 LAB — POCT INR: INR: 2.8

## 2015-01-31 ENCOUNTER — Encounter: Payer: Self-pay | Admitting: Cardiology

## 2015-01-31 ENCOUNTER — Ambulatory Visit (INDEPENDENT_AMBULATORY_CARE_PROVIDER_SITE_OTHER): Payer: Medicare Other | Admitting: Cardiology

## 2015-01-31 VITALS — BP 124/72 | HR 88 | Ht 72.0 in | Wt 180.1 lb

## 2015-01-31 DIAGNOSIS — I2510 Atherosclerotic heart disease of native coronary artery without angina pectoris: Secondary | ICD-10-CM

## 2015-01-31 DIAGNOSIS — I482 Chronic atrial fibrillation, unspecified: Secondary | ICD-10-CM

## 2015-01-31 DIAGNOSIS — Z954 Presence of other heart-valve replacement: Secondary | ICD-10-CM

## 2015-01-31 DIAGNOSIS — I6523 Occlusion and stenosis of bilateral carotid arteries: Secondary | ICD-10-CM | POA: Diagnosis not present

## 2015-01-31 DIAGNOSIS — Z952 Presence of prosthetic heart valve: Secondary | ICD-10-CM

## 2015-01-31 NOTE — Patient Instructions (Signed)
Medication Instructions:  Your physician recommends that you continue on your current medications as directed. Please refer to the Current Medication list given to you today.  Labwork: none  Testing/Procedures: none  Follow-Up: Your physician recommends that you schedule a follow-up appointment in: 6 month ov with Tera Helper NP

## 2015-01-31 NOTE — Progress Notes (Signed)
Cardiology Office Note   Date:  01/31/2015   ID:  Jeffrey Whitney, DOB 25-Sep-1930, MRN [id number]  PCP:  Jani Gravel, MD  Cardiologist: Darlin Coco MD  No chief complaint on file.     History of Present Illness: Jeffrey Whitney is a 81 y.o. male who presents for scheduled 6 month follow-up office visit.  He is a former patient of Dr. Doreatha Lew. Review of Dr. Susa Simmonds old records reveals that he has a history of coronary artery disease with previous bypass surgery. He has a history of a bioprosthetic aortic valve replacement. He has a history of chronic atrial fibrillation and a history of complete heart block with permanent transvenous pacemaker insertion. He has dyslipidemia and diabetes mellitus. A stress test in June 2011 showed no ischemia and his ejection fraction was 42%. His medical Dr. is Dr. Jani Gravel  He initially had coronary artery bypass grafting in 1983. He then had redo coronary artery bypass grafting in 1996. He had a porcine aortic valve replacement by Dr. Evelina Dun at [facility].His wife is not sure what the date of his aortic valve replacement surgery was and he did not have a wallet card indicating the day. He has been followed in our Coumadin clinic since Dr. Susa Simmonds retirement. He has also been followed in our pacemaker clinic by Dr. Rayann Heman. The patient had a recent echocardiogram on 10/09/13: - Left ventricle: Septal apical and inferior wall hypokinesis. The cavity size was moderately dilated. Wall thickness was increased in a pattern of severe LVH. Systolic function was moderately to severely reduced. The estimated ejection fraction was in the range of 30% to 35%.  the patient denies any chest pain or angina.  He is not having any exertional dyspnea although his wife states that because of his dementia he is relatively inactive.  He has had trace edema. He had a chest x-ray in July 2015 which showed mild cardiomegaly.  Since his last visit he has had no  new cardiac symptoms.  His memory is gradually worsening according to his wife.   Past Medical History  Diagnosis Date  . Complete heart block 05/24/2005    Pacemaker (MDT) implanted by Dr Verlon Setting  . CAD (coronary artery disease)     s/p CABG  . PVD (peripheral vascular disease)     s/p remote bilateral femoral bypass at [facility] at [facility]  . HTN (hypertension)   . DM (diabetes mellitus)   . Peripheral autonomic neuropathy due to DM   . Venous insufficiency   . S/P AVR (aortic valve replacement)     mechanical valve  . A-fib     Permanent afib  . Hyperlipidemia     Past Surgical History  Procedure Laterality Date  . Pacemaker insertion  05/24/2005    MDT by Dr Verlon Setting  . Coronary artery bypass graft      at [facility]  . Carotid endarterectomy  06/25/10    at [facility]  . Femoral artery - femoral artery bypass graft      remote  . R carotid stent placement  1/13    at [facility]  . Pacemaker generator change  05/18/12    MDT Sherril Croon SR implanted by Dr Rayann Heman  . Pacemaker generator change N/A 05/18/2012    Procedure: PACEMAKER GENERATOR CHANGE;  Surgeon: Thompson Grayer, MD;  Location: [facility] CATH LAB;  Service: Cardiovascular;  Laterality: N/A;     Current Outpatient Prescriptions  Medication Sig Dispense Refill  . acetaminophen (TYLENOL) 500 MG  tablet Take 500 mg by mouth every 6 (six) hours as needed for moderate pain.    Marland Kitchen aspirin 81 MG tablet Take 81 mg by mouth daily.     Marland Kitchen atorvastatin (LIPITOR) 40 MG tablet Take 40 mg by mouth at bedtime.      . bimatoprost (LUMIGAN) 0.03 % ophthalmic drops Place 1 drop into both eyes at bedtime.      . donepezil (ARICEPT) 5 MG tablet Take 5 mg by mouth at bedtime.    . folic acid (FOLVITE) 1 MG tablet Take 1 mg by mouth daily.      . furosemide (LASIX) 40 MG tablet Take 40 mg by mouth daily.     . metFORMIN (GLUCOPHAGE) 500 MG tablet Take 1,000 mg by mouth daily.     . metoprolol (LOPRESSOR) 50 MG tablet Take 25 mg by mouth 2 (two) times daily.       . Multiple Vitamin (MULTIVITAMIN) tablet Take 1 tablet by mouth daily.      . QUEtiapine Fumarate (SEROQUEL PO) Take 1 capsule by mouth daily. Pt unsure of strength    . timolol (TIMOPTIC) 0.5 % ophthalmic solution Place 1 drop into both eyes daily.    . traMADol (ULTRAM) 50 MG tablet Take 50 mg by mouth every 6 (six) hours as needed. For pain    . valsartan (DIOVAN) 160 MG tablet Take 160 mg by mouth daily.      . vitamin D, CHOLECALCIFEROL, 400 UNITS tablet Take 400 Units by mouth daily.      Marland Kitchen warfarin (COUMADIN) 5 MG tablet TAKE AS DIRECTED BY COUMADIN CLINIC 40 tablet 1   No current facility-administered medications for this visit.    Allergies:   Review of patient's allergies indicates no known allergies.    Social History:  The patient  reports that he quit smoking about 20 years ago. His smoking use included Cigarettes. He quit after 20 years of use. He does not have any smokeless tobacco history on file. He reports that he does not drink alcohol or use illicit drugs.   Family History:  The patient's family history includes Coronary artery disease in an other family member; Diabetes in his brother; Pulmonary embolism in his mother.    ROS:  Please see the history of present illness.   Otherwise, review of systems are positive for none.   All other systems are reviewed and negative.    PHYSICAL EXAM: VS:  BP 124/72 mmHg  Pulse 88  Ht 6' (1.829 m)  Wt 180 lb 1.9 oz (81.702 kg)  BMI 24.42 kg/m2 , BMI Body mass index is 24.42 kg/(m^2). GEN: Well nourished, well developed, in no acute distress HEENT: normal Neck: no JVD, carotid bruits, or masses Cardiac: RRR; paced rhythm.  Soft systolic ejection murmur across the prosthetic aortic valve.  No diastolic murmur.  No gallop or rub.  Trace peripheral edema. Respiratory:  clear to auscultation bilaterally, normal work of breathing GI: soft, nontender, nondistended, + BS MS: no deformity or atrophy Skin: warm and dry, no  rash Neuro:  Strength and sensation are intact Psych: euthymic mood, full affect   EKG:  EKG is ordered today. The ekg ordered today demonstrates underlying atrial fibrillation.  Paced ventricular rhythm.   Recent Labs: No results found for requested labs within last 365 days.    Lipid Panel No results found for: CHOL, TRIG, HDL, CHOLHDL, VLDL, LDLCALC, LDLDIRECT    Wt Readings from Last 3 Encounters:  01/31/15 180 lb  1.9 oz (81.702 kg)  12/30/14 177 lb (80.287 kg)  07/15/14 182 lb 8 oz (82.781 kg)         ASSESSMENT AND PLAN:  1. Ischemic heart disease status post CABG originally in 1983 and then redo CABG in 1996. Ischemic cardiomyopathy with left ventricular dysfunction. No symptoms of heart failure. 2. porcine aortic valve replacement by Dr. Evelina Dun at [facility] for severe aortic stenosis 3. memory loss 4. permanent atrial fibrillation on Coumadin 5. functioning pacemaker followed by Dr. Rayann Heman, initially placed by Dr. Verlon Setting in 2007 6. weight loss, non-intentional  Plan: Continue same medication. Okay chest x-ray and lab work today. Recheck in 4 months for cardiology followup.   Current medicines are reviewed at length with the patient today.  The patient does not have concerns regarding medicines.  The following changes have been made:  no change  Labs/ tests ordered today include:   Orders Placed This Encounter  Procedures  . EKG 12-Lead    Disposition: Continue current medication.  He is on beta blocker and ARB for his left ventricular systolic dysfunction.  Recheck in 6 months.  Berna Spare MD 01/31/2015 5:45 PM    [address], [place]  [address] Phone: [phone]; Fax: [phone]

## 2015-02-07 ENCOUNTER — Ambulatory Visit (INDEPENDENT_AMBULATORY_CARE_PROVIDER_SITE_OTHER): Payer: Medicare Other | Admitting: Pharmacist

## 2015-02-07 DIAGNOSIS — I4891 Unspecified atrial fibrillation: Secondary | ICD-10-CM

## 2015-02-07 DIAGNOSIS — I482 Chronic atrial fibrillation, unspecified: Secondary | ICD-10-CM

## 2015-02-07 LAB — POCT INR: INR: 2.6

## 2015-02-11 DIAGNOSIS — Z23 Encounter for immunization: Secondary | ICD-10-CM | POA: Diagnosis not present

## 2015-02-28 ENCOUNTER — Ambulatory Visit (INDEPENDENT_AMBULATORY_CARE_PROVIDER_SITE_OTHER): Payer: Medicare Other | Admitting: *Deleted

## 2015-02-28 ENCOUNTER — Other Ambulatory Visit: Payer: Self-pay | Admitting: Internal Medicine

## 2015-02-28 DIAGNOSIS — I482 Chronic atrial fibrillation, unspecified: Secondary | ICD-10-CM

## 2015-02-28 DIAGNOSIS — I4891 Unspecified atrial fibrillation: Secondary | ICD-10-CM | POA: Diagnosis not present

## 2015-02-28 LAB — POCT INR: INR: 2.6

## 2015-03-24 ENCOUNTER — Ambulatory Visit (INDEPENDENT_AMBULATORY_CARE_PROVIDER_SITE_OTHER): Payer: Medicare Other | Admitting: *Deleted

## 2015-03-24 DIAGNOSIS — I482 Chronic atrial fibrillation, unspecified: Secondary | ICD-10-CM

## 2015-03-24 DIAGNOSIS — I4891 Unspecified atrial fibrillation: Secondary | ICD-10-CM | POA: Diagnosis not present

## 2015-03-24 LAB — POCT INR: INR: 2.4

## 2015-04-10 DIAGNOSIS — E119 Type 2 diabetes mellitus without complications: Secondary | ICD-10-CM | POA: Diagnosis not present

## 2015-04-10 DIAGNOSIS — E039 Hypothyroidism, unspecified: Secondary | ICD-10-CM | POA: Diagnosis not present

## 2015-04-10 DIAGNOSIS — E7800 Pure hypercholesterolemia, unspecified: Secondary | ICD-10-CM | POA: Diagnosis not present

## 2015-04-10 DIAGNOSIS — I10 Essential (primary) hypertension: Secondary | ICD-10-CM | POA: Diagnosis not present

## 2015-04-17 DIAGNOSIS — E7800 Pure hypercholesterolemia, unspecified: Secondary | ICD-10-CM | POA: Diagnosis not present

## 2015-04-17 DIAGNOSIS — M25562 Pain in left knee: Secondary | ICD-10-CM | POA: Diagnosis not present

## 2015-04-17 DIAGNOSIS — E139 Other specified diabetes mellitus without complications: Secondary | ICD-10-CM | POA: Diagnosis not present

## 2015-04-17 DIAGNOSIS — F0390 Unspecified dementia without behavioral disturbance: Secondary | ICD-10-CM | POA: Diagnosis not present

## 2015-05-07 ENCOUNTER — Ambulatory Visit (INDEPENDENT_AMBULATORY_CARE_PROVIDER_SITE_OTHER): Payer: Medicare Other | Admitting: Pharmacist

## 2015-05-07 DIAGNOSIS — I482 Chronic atrial fibrillation, unspecified: Secondary | ICD-10-CM

## 2015-05-07 DIAGNOSIS — I4891 Unspecified atrial fibrillation: Secondary | ICD-10-CM | POA: Diagnosis not present

## 2015-05-07 LAB — POCT INR: INR: 3.4

## 2015-05-22 DIAGNOSIS — H524 Presbyopia: Secondary | ICD-10-CM | POA: Diagnosis not present

## 2015-05-22 DIAGNOSIS — H401132 Primary open-angle glaucoma, bilateral, moderate stage: Secondary | ICD-10-CM | POA: Diagnosis not present

## 2015-05-22 DIAGNOSIS — H25813 Combined forms of age-related cataract, bilateral: Secondary | ICD-10-CM | POA: Diagnosis not present

## 2015-05-22 DIAGNOSIS — H01001 Unspecified blepharitis right upper eyelid: Secondary | ICD-10-CM | POA: Diagnosis not present

## 2015-05-28 DIAGNOSIS — E119 Type 2 diabetes mellitus without complications: Secondary | ICD-10-CM | POA: Diagnosis not present

## 2015-05-28 DIAGNOSIS — I10 Essential (primary) hypertension: Secondary | ICD-10-CM | POA: Diagnosis not present

## 2015-05-28 DIAGNOSIS — E7800 Pure hypercholesterolemia, unspecified: Secondary | ICD-10-CM | POA: Diagnosis not present

## 2015-05-28 DIAGNOSIS — R413 Other amnesia: Secondary | ICD-10-CM | POA: Diagnosis not present

## 2015-06-05 ENCOUNTER — Ambulatory Visit (INDEPENDENT_AMBULATORY_CARE_PROVIDER_SITE_OTHER): Payer: Medicare Other | Admitting: Pharmacist

## 2015-06-05 DIAGNOSIS — I482 Chronic atrial fibrillation, unspecified: Secondary | ICD-10-CM

## 2015-06-05 DIAGNOSIS — I4891 Unspecified atrial fibrillation: Secondary | ICD-10-CM

## 2015-06-05 LAB — POCT INR: INR: 3

## 2015-06-23 IMAGING — US US ABDOMEN COMPLETE
1 series · 14 of 25 positions shown · non-contrast
Comparison: None.

CLINICAL DATA: Elevated LFT.

EXAM:
ULTRASOUND ABDOMEN COMPLETE

[Series 1: us abdomen complete · 0.21mm/px · 14 of 101 slices shown]
[im 1/101]
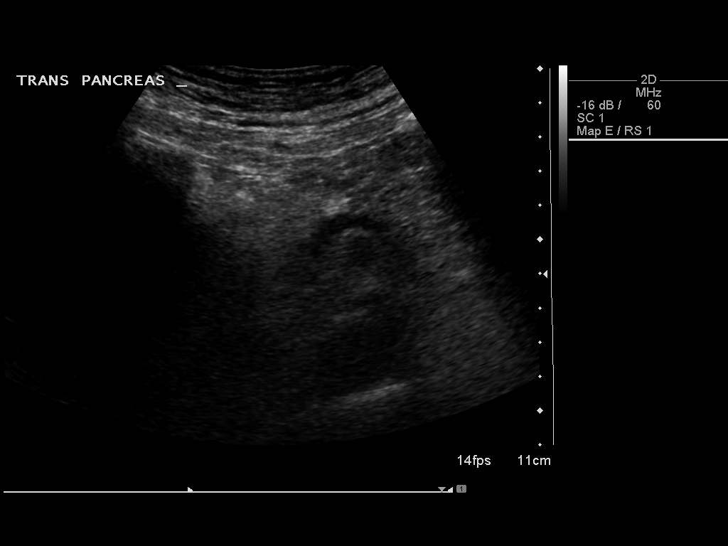
[im 9/101]
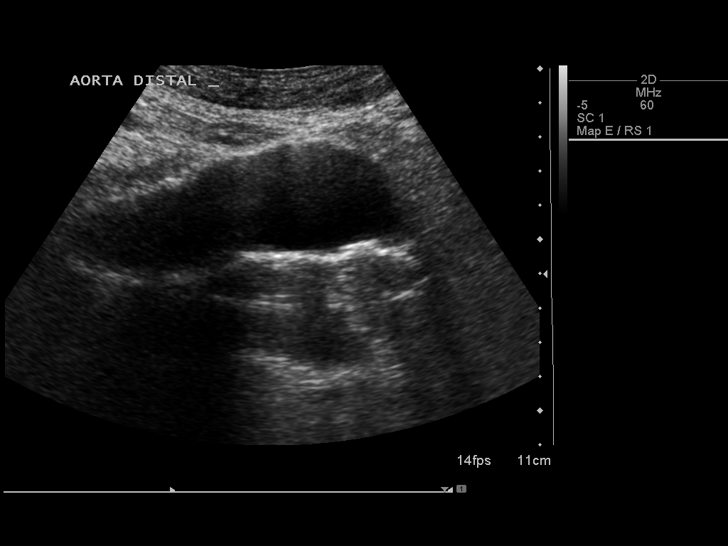
[im 17/101]
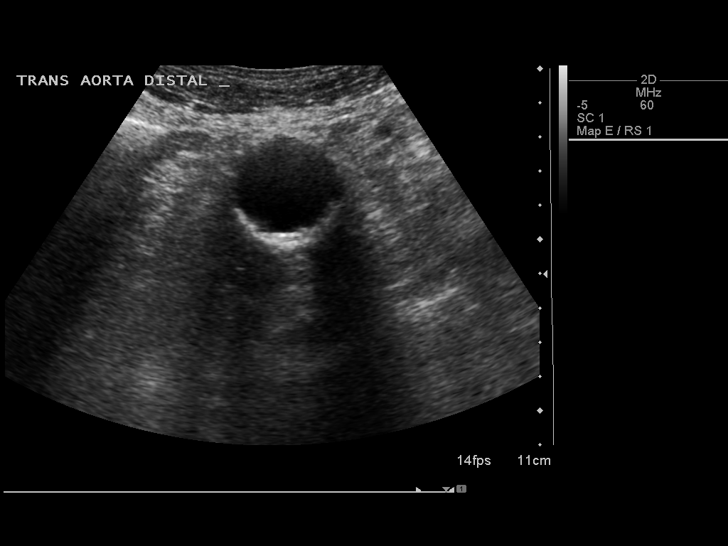
[im 26/101]
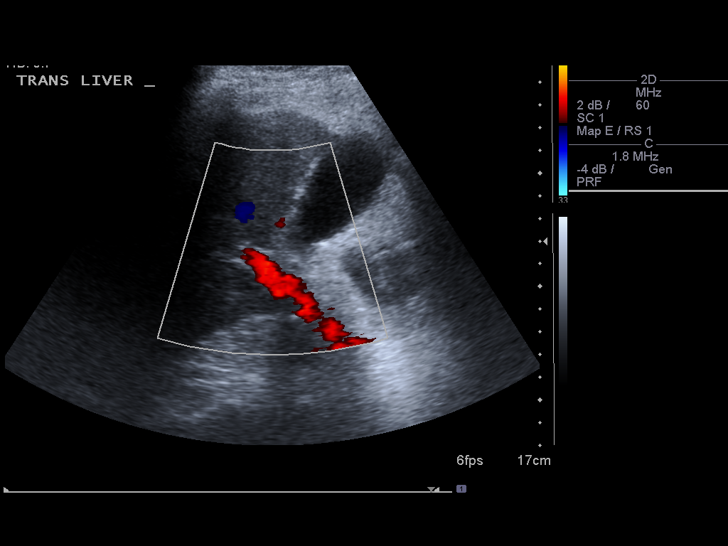
[im 34/101]
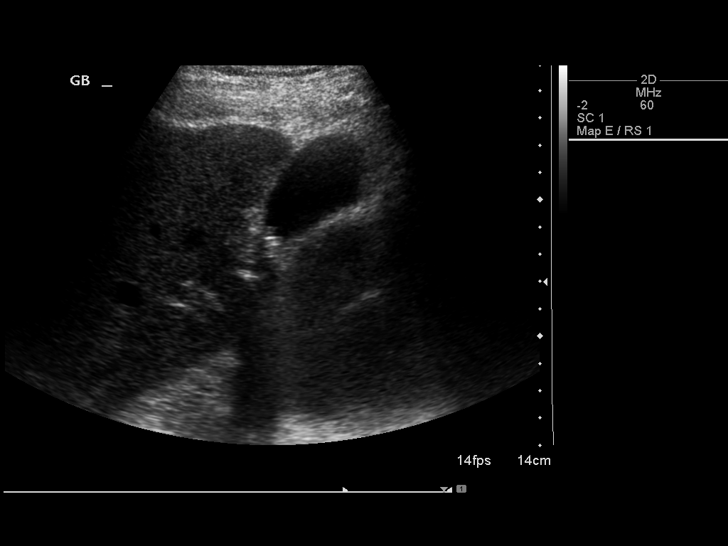
[im 38/101]
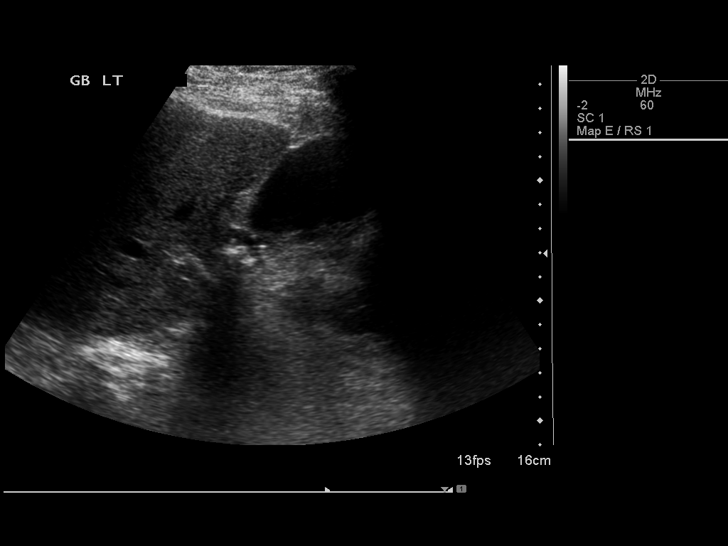
[im 46/101]
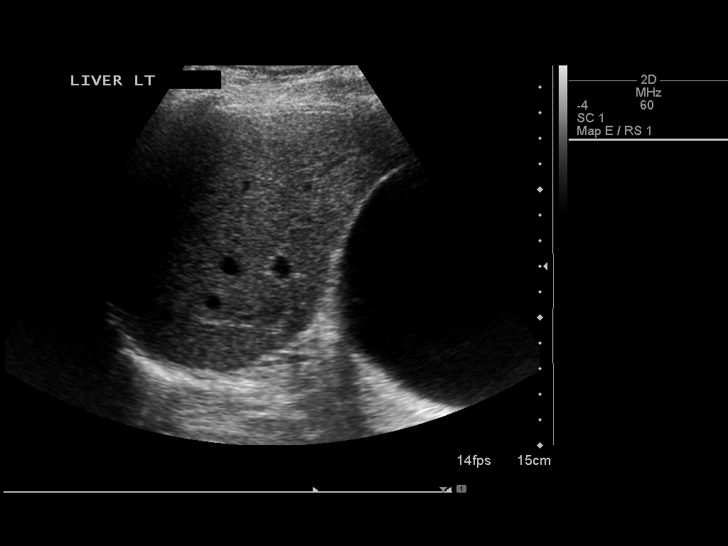
[im 55/101]
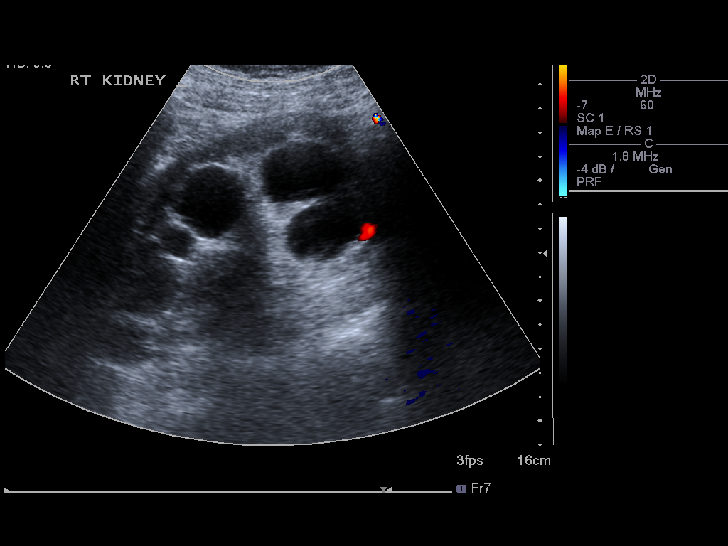
[im 63/101]
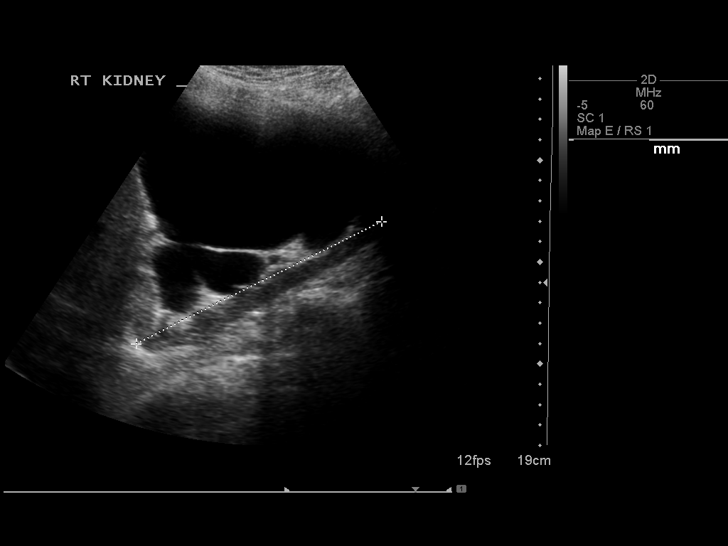
[im 67/101]
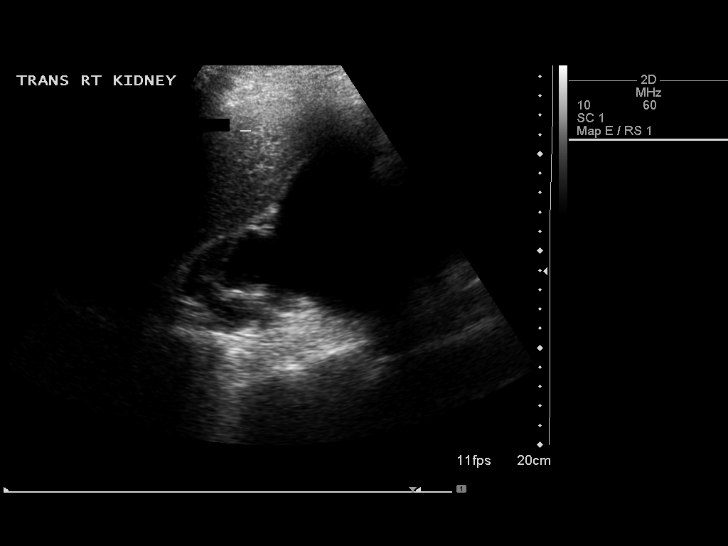
[im 76/101]
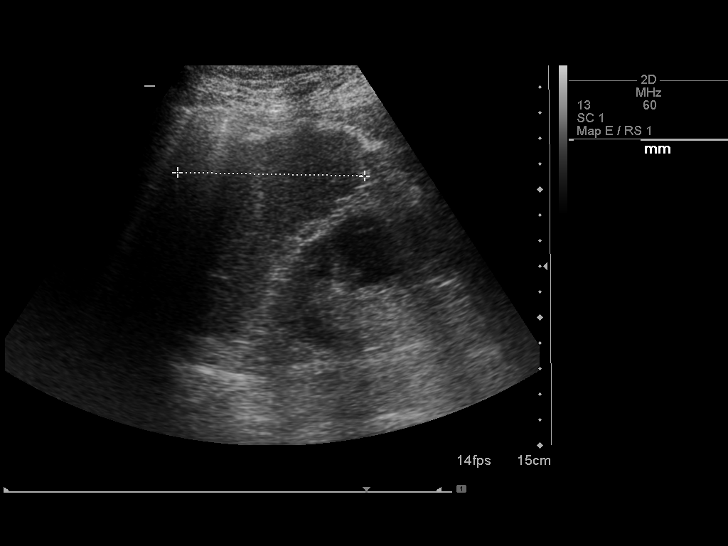
[im 84/101]
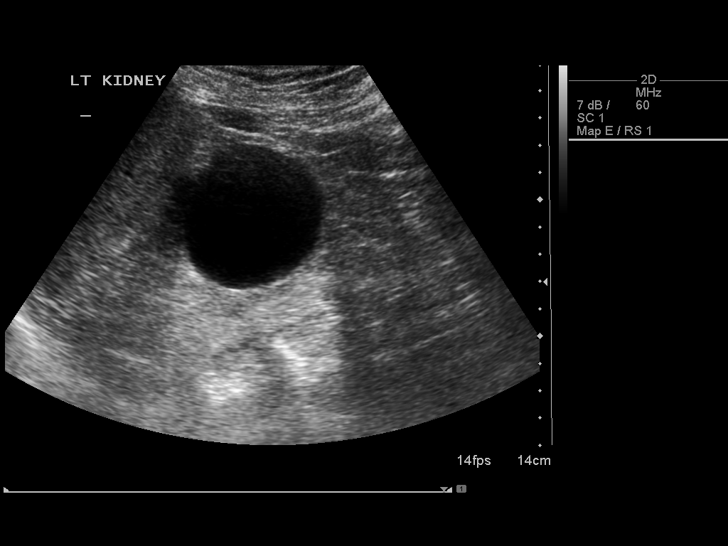
[im 92/101]
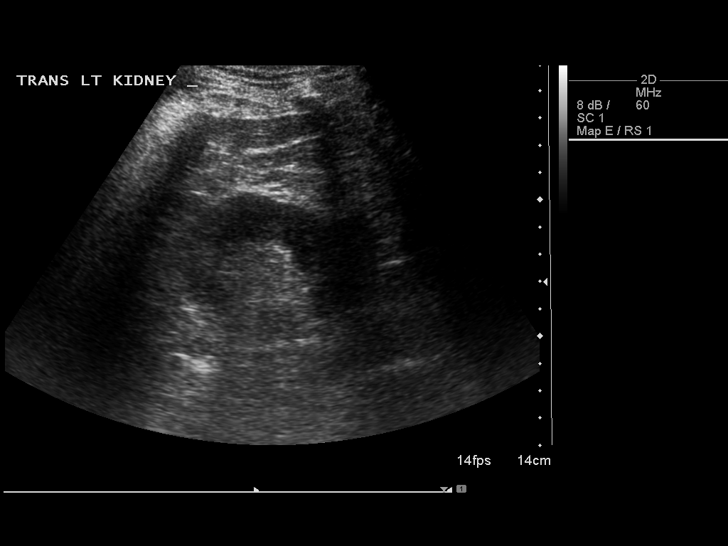
[im 101/101]
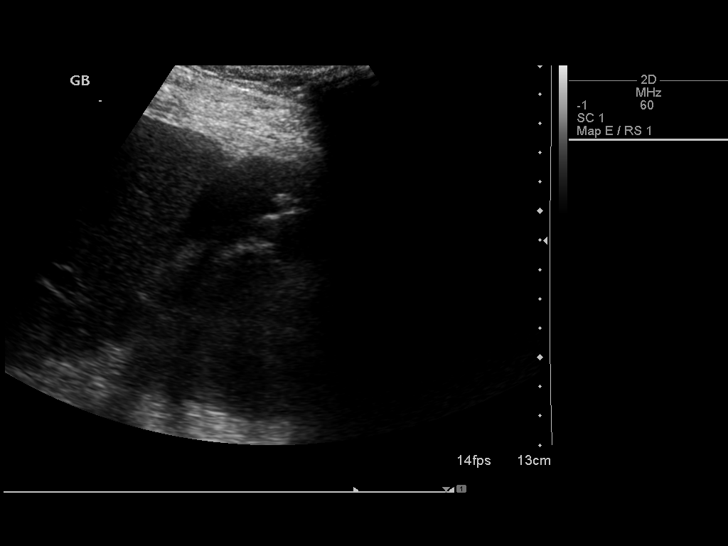

[14 of 25 positions shown; findings below may reference images not displayed]

FINDINGS: Gallbladder: Multiple small gallstones measuring under 6 mm.
Negative sonographic Murphy sign. Gallbladder wall not thickened.

Common bile duct: Diameter: 3.1 mm

Liver: No focal lesion identified. Within normal limits in
parenchymal echogenicity.

IVC: Limited

Pancreas: Limited

Spleen: Size and appearance within normal limits.

Right Kidney: Length: 13.5 cm.. Very large medial cyst measures 10 x
11 cm. Additional cystic spaces in the right kidney could be due to
hydronephrosis however due to the large cyst it is difficult to
evaluate the right kidney and confirm that these are related to the
collecting system versus individual cysts.

Left Kidney: Length: 13.0 cm. 6 cm upper pole cyst.. Echogenicity
within normal limits. No mass or hydronephrosis visualized.

Abdominal aorta: Atherosclerotic aorta. Abdominal aortic aneurysm
measures 3.6 x 3.7 cm.

Other findings: None.
IMPRESSION: Cholelithiasis.

Large medial right renal cyst 10 x 11 cm which may be causing
hydronephrosis. Further evaluation with CT with contrast would be
useful.

## 2015-07-03 DIAGNOSIS — H401132 Primary open-angle glaucoma, bilateral, moderate stage: Secondary | ICD-10-CM | POA: Diagnosis not present

## 2015-07-17 ENCOUNTER — Ambulatory Visit (INDEPENDENT_AMBULATORY_CARE_PROVIDER_SITE_OTHER): Payer: Medicare Other | Admitting: *Deleted

## 2015-07-17 DIAGNOSIS — I4891 Unspecified atrial fibrillation: Secondary | ICD-10-CM | POA: Diagnosis not present

## 2015-07-17 DIAGNOSIS — I482 Chronic atrial fibrillation, unspecified: Secondary | ICD-10-CM

## 2015-07-17 LAB — POCT INR: INR: 3.1

## 2015-07-21 ENCOUNTER — Ambulatory Visit (HOSPITAL_COMMUNITY)
Admission: RE | Admit: 2015-07-21 | Discharge: 2015-07-21 | Disposition: A | Discharge status: Home / Self Care | Payer: Medicare Other | Source: Ambulatory Visit | Attending: Surgery | Admitting: Surgery

## 2015-07-21 ENCOUNTER — Encounter: Payer: Self-pay | Admitting: Family

## 2015-07-21 DIAGNOSIS — E785 Hyperlipidemia, unspecified: Secondary | ICD-10-CM | POA: Diagnosis not present

## 2015-07-21 DIAGNOSIS — I6523 Occlusion and stenosis of bilateral carotid arteries: Secondary | ICD-10-CM | POA: Diagnosis not present

## 2015-07-21 DIAGNOSIS — Z48812 Encounter for surgical aftercare following surgery on the circulatory system: Secondary | ICD-10-CM | POA: Diagnosis not present

## 2015-07-21 DIAGNOSIS — E119 Type 2 diabetes mellitus without complications: Secondary | ICD-10-CM | POA: Insufficient documentation

## 2015-07-21 DIAGNOSIS — I10 Essential (primary) hypertension: Secondary | ICD-10-CM | POA: Diagnosis not present

## 2015-07-21 DIAGNOSIS — I6522 Occlusion and stenosis of left carotid artery: Secondary | ICD-10-CM | POA: Insufficient documentation

## 2015-07-23 ENCOUNTER — Encounter: Payer: Self-pay | Admitting: Family

## 2015-07-28 ENCOUNTER — Ambulatory Visit: Payer: Medicare Other | Admitting: Family

## 2015-07-28 DIAGNOSIS — I10 Essential (primary) hypertension: Secondary | ICD-10-CM | POA: Diagnosis not present

## 2015-07-28 DIAGNOSIS — R413 Other amnesia: Secondary | ICD-10-CM | POA: Diagnosis not present

## 2015-07-28 DIAGNOSIS — E119 Type 2 diabetes mellitus without complications: Secondary | ICD-10-CM | POA: Diagnosis not present

## 2015-07-28 DIAGNOSIS — E7800 Pure hypercholesterolemia, unspecified: Secondary | ICD-10-CM | POA: Diagnosis not present

## 2015-07-31 ENCOUNTER — Ambulatory Visit (INDEPENDENT_AMBULATORY_CARE_PROVIDER_SITE_OTHER): Payer: Medicare Other | Admitting: Family

## 2015-07-31 ENCOUNTER — Encounter: Payer: Self-pay | Admitting: Family

## 2015-07-31 ENCOUNTER — Other Ambulatory Visit: Payer: Self-pay | Admitting: *Deleted

## 2015-07-31 VITALS — BP 145/84 | HR 73 | Ht 72.0 in | Wt 191.5 lb

## 2015-07-31 DIAGNOSIS — Z48812 Encounter for surgical aftercare following surgery on the circulatory system: Secondary | ICD-10-CM

## 2015-07-31 DIAGNOSIS — I6523 Occlusion and stenosis of bilateral carotid arteries: Secondary | ICD-10-CM

## 2015-07-31 DIAGNOSIS — Z959 Presence of cardiac and vascular implant and graft, unspecified: Secondary | ICD-10-CM | POA: Diagnosis not present

## 2015-07-31 NOTE — Progress Notes (Signed)
Chief Complaint: Extracranial Carotid Artery Stenosis   History of Present Illness  Jeffrey Whitney is a 82 y.o. male patient of Dr. Trula Slade who was referred to him for reestablishing vascular care. The patient has a significant carotid artery history, all done at [facility]. He is status post carotid endarterectomy, and most recently carotid stenting in 2012. Per the patient, these were all 4 asymptomatic lesions. He remains asymptomatic. Today, he denies numbness or weakness in either extremity. He denies third speech. He denies amaurosis fugax.  The patient has a significant coronary history. He has undergone CABG in 1983 and 1996. He has also had a porcine aortic valve replaced at [facility]. A permanent pacemaker has been implanted for complete heart block. He is anticoagulated for atrial fibrillation.  The patient is a diabetic which is been complicated by neuropathy. His hypercholesterolemia is managed with a statin.  The patient denies any history of TIA or stroke symptoms, specifically the patient denies a history of amaurosis fugax or monocular blindness, denies a history unilateral  of facial drooping, denies a history of hemiplegia, and denies a history of receptive or expressive aphasia.    The patient denies New Medical or Surgical History.  Pt Diabetic: yes, does not know his A1C, not on file, wife states he rarely checks his blood sugar Pt smoker: former smoker, quit about 1995  Pt meds include: Statin : yes ASA: yes Other anticoagulants/antiplatelets: warfarin,  is s/p aortic valve replacement, has a hx of atrial fib   Past Medical History  Diagnosis Date  . Complete heart block ([place]) 05/24/2005    Pacemaker (MDT) implanted by Dr Verlon Setting  . CAD (coronary artery disease)     s/p CABG  . PVD (peripheral vascular disease) ([place])     s/p remote bilateral femoral bypass at [facility], CEA at [facility]  . HTN (hypertension)   . DM (diabetes mellitus) ([place])   .  Peripheral autonomic neuropathy due to DM ([place])   . Venous insufficiency   . S/P AVR (aortic valve replacement)     mechanical valve  . A-fib ([facility])     Permanent afib  . Hyperlipidemia     Social History Social History  Substance Use Topics  . Smoking status: Former Smoker -- 20 years    Types: Cigarettes    Quit date: 07/15/1994  . Smokeless tobacco: None  . Alcohol Use: No    Family History Family History  Problem Relation Age of Onset  . Coronary artery disease    . Pulmonary embolism Mother   . Diabetes Brother     Surgical History Past Surgical History  Procedure Laterality Date  . Pacemaker insertion  05/24/2005    MDT by Dr Verlon Setting  . Coronary artery bypass graft      at [facility]  . Carotid endarterectomy  06/25/10    at [facility]  . Femoral artery - femoral artery bypass graft      remote  . R carotid stent placement  1/13    at [facility]  . Pacemaker generator change  05/18/12    MDT Sherril Croon SR implanted by Dr Rayann Heman  . Pacemaker generator change N/A 05/18/2012    Procedure: PACEMAKER GENERATOR CHANGE;  Surgeon: Thompson Grayer, MD;  Location: [facility] CATH LAB;  Service: Cardiovascular;  Laterality: N/A;    No Known Allergies  Current Outpatient Prescriptions  Medication Sig Dispense Refill  . acetaminophen (TYLENOL) 500 MG tablet Take 500 mg by mouth every 6 (six) hours as needed  for moderate pain.    Marland Kitchen aspirin 81 MG tablet Take 81 mg by mouth daily.     Marland Kitchen atorvastatin (LIPITOR) 40 MG tablet Take 40 mg by mouth at bedtime.      . bimatoprost (LUMIGAN) 0.03 % ophthalmic drops Place 1 drop into both eyes at bedtime.      . donepezil (ARICEPT) 5 MG tablet Take 5 mg by mouth at bedtime.    . folic acid (FOLVITE) 1 MG tablet Take 1 mg by mouth daily.      . furosemide (LASIX) 40 MG tablet Take 40 mg by mouth daily.     . metFORMIN (GLUCOPHAGE) 500 MG tablet Take 1,000 mg by mouth daily.     . metoprolol (LOPRESSOR) 50 MG tablet Take 25 mg by mouth 2 (two) times daily.     .  Multiple Vitamin (MULTIVITAMIN) tablet Take 1 tablet by mouth daily.      . timolol (TIMOPTIC) 0.5 % ophthalmic solution Place 1 drop into both eyes daily.    . traMADol (ULTRAM) 50 MG tablet Take 50 mg by mouth every 6 (six) hours as needed. For pain    . valsartan (DIOVAN) 160 MG tablet Take 160 mg by mouth daily.      . vitamin D, CHOLECALCIFEROL, 400 UNITS tablet Take 400 Units by mouth daily.      Marland Kitchen warfarin (COUMADIN) 5 MG tablet TAKE AS DIRECTED BY COUMADIN CLINIC 40 tablet 3  . QUEtiapine Fumarate (SEROQUEL PO) Take 1 capsule by mouth daily. Reported on 07/31/2015     No current facility-administered medications for this visit.    Review of Systems : See HPI for pertinent positives and negatives.  Physical Examination  Filed Vitals:   07/31/15 1015 07/31/15 1017  BP: 155/86 145/84  Pulse: 73   Height: 6' (1.829 m)   Weight: 191 lb 8 oz (86.864 kg)   SpO2: 97%    Body mass index is 25.97 kg/(m^2).  General: WDWN male in NAD GAIT: normal Eyes: PERRLA Pulmonary:  Non-labored, CTAB, no rales, rhonchi, or wheezing.  Cardiac: regular rhythm,  no detected murmur. Pacemaker palpated subcutaneous left upper chest.  VASCULAR EXAM Carotid Bruits Right Left   Negative Negative    Aorta is not palpable. Radial pulses are 2+ palpable and equal.                                                                                                                            LE Pulses Right Left       POPLITEAL   palpable    palpable       POSTERIOR TIBIAL   palpable    palpable        DORSALIS PEDIS      ANTERIOR TIBIAL not palpable  not palpable     Gastrointestinal: soft, nontender, BS WNL, no r/g,  no palpable masses.  Musculoskeletal: no muscle atrophy/wasting. M/S 5/5 throughout, extremities without ischemic changes.  Neurologic: A&O X 2; Appropriate Affect, Speech is normal CN 2-12 intact except is hard of hearing, pain and light touch intact in extremities, Motor exam  as listed above.    Non-Invasive Vascular Imaging CAROTID DUPLEX 07/21/2015   CEREBROVASCULAR DUPLEX EVALUATION     INDICATION: Carotid artery disease; evaluation status post stent placement.    PREVIOUS INTERVENTION(S): Right internal carotid artery stent 2012    DUPLEX EXAM:     RIGHT  LEFT  Peak Systolic Velocities (cm/s) End Diastolic Velocities (cm/s) Plaque LOCATION Peak Systolic Velocities (cm/s) End Diastolic Velocities (cm/s) Plaque  59 8 HT CCA PROXIMAL 59 13 HT  90 12 HT CCA MID 66 13 HT  61 9 HT CCA DISTAL 54 13 HT  115 12  ECA 187 18 HT  Stent   ICA PROXIMAL 57 13 HT  Stent    ICA MID 62 17   70 15  ICA DISTAL 62 13     N/A ICA / CCA Ratio (PSV) .86  Antegrade Vertebral Flow Antegrade  - Brachial Systolic Pressure (mmHg)   T Brachial Artery Waveforms T    Peak Systolic Velocities (cm/s) End Diastolic Velocities (cm/s) Plaque STENT (  ) Peak Systolic Velocities (cm/s) End Diastolic Velocities (cm/s) Plaque  72 18  PROXIMAL     86 18  MID     87 17  DISTAL       Plaque Morphology:  HM = Homogeneous, HT = Heterogeneous, CP = Calcific Plaque, SP = Smooth Plaque, IP = Irregular Plaque    ADDITIONAL FINDINGS:     IMPRESSION: 1. Patent right internal carotid artery stent without evidence for stenosis. 2. Less than 40% left internal carotid artery stenosis    Compared to the previous exam:  No change since exam of 07/15/2014       Assessment: Jeffrey Whitney is a 82 y.o. male who is s/p right ICA stenting in 2012 at [facility]. He has no history of stroke or TIA. He reports DM peripheral neuropathy in his feet, symptoms seem mild or moderate.   07/21/15 carotid duplex suggests right internal carotid artery stent is without evidence for stenosis. Less than 40% left internal carotid artery stenosis. No change since exam of 07/15/2014.   His atherosclerotic risk factors include DM, former smoker, and fairly sedentary lifestyle.  He denies any barriers to  walking, denies feeling unsteady on his feet. I encouraged him to gradually walk more, eventual goal of 30 minutes daily, in a safe environment.    Plan: Follow-up in 1 year with Carotid Duplex scan.   I discussed in depth with the patient the nature of atherosclerosis, and emphasized the importance of maximal medical management including strict control of blood pressure, blood glucose, and lipid levels, obtaining regular exercise, and continued cessation of smoking.  The patient is aware that without maximal medical management the underlying atherosclerotic disease process will progress, limiting the benefit of any interventions. The patient was given information about stroke prevention and what symptoms should prompt the patient to seek immediate medical care. Thank you for allowing [place] to participate in this patient's care.  Clemon Chambers, RN, MSN, FNP-C [facility] Office: [phone]  Clinic Physician: Oneida Alar  07/31/2015 10:21 AM

## 2015-07-31 NOTE — Patient Instructions (Signed)
Stroke Prevention Some medical conditions and behaviors are associated with an increased chance of having a stroke. You may prevent a stroke by making healthy choices and managing medical conditions. HOW CAN I REDUCE MY RISK OF HAVING A STROKE?   Stay physically active. Get at least 30 minutes of activity on most or all days.  Do not smoke. It may also be helpful to avoid exposure to secondhand smoke.  Limit alcohol use. Moderate alcohol use is considered to be:  No more than 2 drinks per day for men.  No more than 1 drink per day for nonpregnant women.  Eat healthy foods. This involves:  Eating 5 or more servings of fruits and vegetables a day.  Making dietary changes that address high blood pressure (hypertension), high cholesterol, diabetes, or obesity.  Manage your cholesterol levels.  Making food choices that are high in fiber and low in saturated fat, trans fat, and cholesterol may control cholesterol levels.  Take any prescribed medicines to control cholesterol as directed by your health care provider.  Manage your diabetes.  Controlling your carbohydrate and sugar intake is recommended to manage diabetes.  Take any prescribed medicines to control diabetes as directed by your health care provider.  Control your hypertension.  Making food choices that are low in salt (sodium), saturated fat, trans fat, and cholesterol is recommended to manage hypertension.  Ask your health care provider if you need treatment to lower your blood pressure. Take any prescribed medicines to control hypertension as directed by your health care provider.  If you are 18-39 years of age, have your blood pressure checked every 3-5 years. If you are 40 years of age or older, have your blood pressure checked every year.  Maintain a healthy weight.  Reducing calorie intake and making food choices that are low in sodium, saturated fat, trans fat, and cholesterol are recommended to manage  weight.  Stop drug abuse.  Avoid taking birth control pills.  Talk to your health care provider about the risks of taking birth control pills if you are over 35 years old, smoke, get migraines, or have ever had a blood clot.  Get evaluated for sleep disorders (sleep apnea).  Talk to your health care provider about getting a sleep evaluation if you snore a lot or have excessive sleepiness.  Take medicines only as directed by your health care provider.  For some people, aspirin or blood thinners (anticoagulants) are helpful in reducing the risk of forming abnormal blood clots that can lead to stroke. If you have the irregular heart rhythm of atrial fibrillation, you should be on a blood thinner unless there is a good reason you cannot take them.  Understand all your medicine instructions.  Make sure that other conditions (such as anemia or atherosclerosis) are addressed. SEEK IMMEDIATE MEDICAL CARE IF:   You have sudden weakness or numbness of the face, arm, or leg, especially on one side of the body.  Your face or eyelid droops to one side.  You have sudden confusion.  You have trouble speaking (aphasia) or understanding.  You have sudden trouble seeing in one or both eyes.  You have sudden trouble walking.  You have dizziness.  You have a loss of balance or coordination.  You have a sudden, severe headache with no known cause.  You have new chest pain or an irregular heartbeat. Any of these symptoms may represent a serious problem that is an emergency. Do not wait to see if the symptoms will   go away. Get medical help at once. Call your local emergency services (911 in [place] Do not drive yourself to the hospital.   This information is not intended to replace advice given to you by your health care provider. Make sure you discuss any questions you have with your health care provider.   Document Released: 05/27/2004 Document Revised: 05/10/2014 Document Reviewed:  10/20/2012 Elsevier Interactive Patient Education 2016 Elsevier Inc.  

## 2015-08-01 ENCOUNTER — Ambulatory Visit (INDEPENDENT_AMBULATORY_CARE_PROVIDER_SITE_OTHER): Payer: Medicare Other | Admitting: Nurse Practitioner

## 2015-08-01 ENCOUNTER — Encounter: Payer: Self-pay | Admitting: Nurse Practitioner

## 2015-08-01 VITALS — BP 180/90 | HR 76 | Ht 72.0 in | Wt 191.8 lb

## 2015-08-01 DIAGNOSIS — I255 Ischemic cardiomyopathy: Secondary | ICD-10-CM | POA: Diagnosis not present

## 2015-08-01 DIAGNOSIS — I5189 Other ill-defined heart diseases: Secondary | ICD-10-CM

## 2015-08-01 DIAGNOSIS — I519 Heart disease, unspecified: Secondary | ICD-10-CM | POA: Diagnosis not present

## 2015-08-01 DIAGNOSIS — I2510 Atherosclerotic heart disease of native coronary artery without angina pectoris: Secondary | ICD-10-CM | POA: Diagnosis not present

## 2015-08-01 DIAGNOSIS — I482 Chronic atrial fibrillation, unspecified: Secondary | ICD-10-CM

## 2015-08-01 DIAGNOSIS — I10 Essential (primary) hypertension: Secondary | ICD-10-CM

## 2015-08-01 DIAGNOSIS — E785 Hyperlipidemia, unspecified: Secondary | ICD-10-CM

## 2015-08-01 MED ORDER — VALSARTAN 320 MG PO TABS: 320.0000 mg | ORAL_TABLET | Freq: Every day | ORAL | Status: DC

## 2015-08-01 NOTE — Patient Instructions (Addendum)
We will be checking the following labs today - NONE   Medication Instructions:    Continue with your current medicines. BUT  I am increasing the Diovan to 320 mg each day - this is at the drug store.     Testing/Procedures To Be Arranged:  N/A  Follow-Up:   See me in 2 weeks with a BP diary    Other Special Instructions:   Monitor your BP for me - keep a diary  Let your wife oversee all your medicines.    If you need a refill on your cardiac medications before your next appointment, please call your pharmacy.   Call the [place] office at [phone] if you have any questions, problems or concerns.

## 2015-08-01 NOTE — Progress Notes (Signed)
CARDIOLOGY OFFICE NOTE  Date:  08/01/2015    Jeffrey Whitney Date of Birth: 1930/12/11 Medical Record [id number]  PCP:  Jeffrey Gravel, MD  Cardiologist:  Jeffrey Whitney    Chief Complaint  Patient presents with  . Congestive Heart Failure  . Cardiomyopathy  . Cardiac Valve Problem  . Coronary Artery Disease  . Hyperlipidemia  . Hypertension  . Atrial Fibrillation    Follow up visit - seen for Jeffrey. Rayann Whitney - former patient of Jeffrey. Sherryl Whitney    History of Present Illness: Jeffrey Whitney is a 82 y.o. male who presents today for a follow up visit. Seen for Jeffrey. Rayann Whitney. Former patient of Jeffrey. Sherryl Whitney and Jeffrey. Susa Whitney.   He has a history of CAD with prior CABG initially in 1983 with redo CABG in 1996, prior AVR at [facility]. Other issues include chronic atrial fib, CHB with PPM in place - followed by Jeffrey. Rayann Whitney. HLD and HTN.  A stress test in June 2011 showed no ischemia and his ejection fraction was 42%. Echo from 2015 with EF of 30 to 35%. He has had some progressive memory issues.   Last seen here in September by Jeffrey. Mare Whitney. Saw Jeffrey. Rayann Whitney in August.   Comes back today. Here with his wife. BP elevated. Apparently does his own medicines except for the Coumadin - wife manages that due to him missing some pills in the past. He is not sure about his dose/formula of his beta blocker. He is not sure if he has missed any of his medicines. Cannot tell me what they are. Has seen Jeffrey. Maudie Whitney with recent labs. No chest pain. He has regained all the weight he had lost. Not short of breath. Pretty sedentary.    Past Medical History  Diagnosis Date  . Complete heart block ([place]) 05/24/2005    Pacemaker (MDT) implanted by Jeffrey Whitney  . CAD (coronary artery disease)     s/p CABG  . PVD (peripheral vascular disease) ([place])     s/p remote bilateral femoral bypass at [facility], CEA at [facility]  . HTN (hypertension)   . DM (diabetes mellitus) ([place])   . Peripheral autonomic neuropathy due to DM ([place])   .  Venous insufficiency   . S/P AVR (aortic valve replacement)     mechanical valve  . A-fib ([facility])     Permanent afib  . Hyperlipidemia     Past Surgical History  Procedure Laterality Date  . Pacemaker insertion  05/24/2005    MDT by Jeffrey Whitney  . Coronary artery bypass graft      at [facility]  . Carotid endarterectomy  06/25/10    at [facility]  . Femoral artery - femoral artery bypass graft      remote  . R carotid stent placement  1/13    at [facility]  . Pacemaker generator change  05/18/12    MDT Jeffrey Whitney implanted by Jeffrey Jeffrey Whitney  . Pacemaker generator change N/A 05/18/2012    Procedure: PACEMAKER GENERATOR CHANGE;  Surgeon: Jeffrey Grayer, MD;  Location: [facility] CATH LAB;  Service: Cardiovascular;  Laterality: N/A;     Medications: Current Outpatient Prescriptions  Medication Sig Dispense Refill  . acetaminophen (TYLENOL) 500 MG tablet Take 500 mg by mouth every 6 (six) hours as needed for moderate pain.    Marland Kitchen aspirin 81 MG tablet Take 81 mg by mouth daily.     Marland Kitchen atorvastatin (LIPITOR) 40 MG tablet Take 40 mg by mouth at bedtime.      Marland Kitchen  bimatoprost (LUMIGAN) 0.03 % ophthalmic drops Place 1 drop into both eyes at bedtime.      . donepezil (ARICEPT) 5 MG tablet Take 5 mg by mouth at bedtime.    . folic acid (FOLVITE) 1 MG tablet Take 1 mg by mouth daily.      . furosemide (LASIX) 40 MG tablet Take 40 mg by mouth daily.     . metFORMIN (GLUCOPHAGE) 500 MG tablet Take 1,000 mg by mouth daily.     . metoprolol (LOPRESSOR) 50 MG tablet Take 25 mg by mouth 2 (two) times daily.     . Multiple Vitamin (MULTIVITAMIN) tablet Take 1 tablet by mouth daily.      . QUEtiapine Fumarate (SEROQUEL PO) Take 1 capsule by mouth daily. Reported on 07/31/2015    . timolol (TIMOPTIC) 0.5 % ophthalmic solution Place 1 drop into both eyes daily.    . traMADol (ULTRAM) 50 MG tablet Take 50 mg by mouth every 6 (six) hours as needed. For pain    . vitamin D, CHOLECALCIFEROL, 400 UNITS tablet Take 400 Units by mouth daily.       Marland Kitchen warfarin (COUMADIN) 5 MG tablet TAKE AS DIRECTED BY COUMADIN CLINIC 40 tablet 3  . valsartan (DIOVAN) 320 MG tablet Take 1 tablet (320 mg total) by mouth daily. 90 tablet 3   No current facility-administered medications for this visit.    Allergies: No Known Allergies  Social History: The patient  reports that he quit smoking about 21 years ago. His smoking use included Cigarettes. He quit after 20 years of use. He does not have any smokeless tobacco history on file. He reports that he does not drink alcohol or use illicit drugs.   Family History: The patient's family history includes Diabetes in his brother; Pulmonary embolism in his mother.   Review of Systems: Please see the history of present illness.   Otherwise, the review of systems is positive for none.   All other systems are reviewed and negative.   Physical Exam: VS:  BP 180/90 mmHg  Pulse 76  Ht 6' (1.829 m)  Wt 191 lb 12.8 oz (87 kg)  BMI 26.01 kg/m2 .  BMI Body mass index is 26.01 kg/(m^2).  Wt Readings from Last 3 Encounters:  08/01/15 191 lb 12.8 oz (87 kg)  07/31/15 191 lb 8 oz (86.864 kg)  01/31/15 180 lb 1.9 oz (81.702 kg)    General: Pleasant. He seems forgetful. He is alert and in no acute distress. He has gained weight back.  HEENT: Normal. Neck: Supple, no JVD, carotid bruits, or masses noted.  Cardiac: Fairly regular rate and rhythm. No murmurs, rubs, or gallops. No edema.  Respiratory:  Lungs are clear to auscultation bilaterally with normal work of breathing.  GI: Soft and nontender.  MS: No deformity or atrophy. Gait and ROM intact. Skin: Warm and dry. Color is normal.  Neuro:  Strength and sensation are intact and no gross focal deficits noted.  Psych: Alert, appropriate and with normal affect.   LABORATORY DATA:  EKG:  EKG is not ordered today.  Lab Results  Component Value Date   WBC 9.7 11/28/2013   HGB 15.4 11/28/2013   HCT 44.8 11/28/2013   PLT 102* 11/28/2013   GLUCOSE 154*  11/28/2013   ALT 24 11/28/2013   AST 29 11/28/2013   NA 142 11/28/2013   K 4.2 11/28/2013   CL 105 11/28/2013   CREATININE 0.89 11/28/2013   BUN 17 11/28/2013  CO2 24 11/28/2013   TSH 0.81 11/16/2013   INR 3.1 07/17/2015   Lab Results  Component Value Date   INR 3.1 07/17/2015   INR 3.0 06/05/2015   INR 3.4 05/07/2015     BNP (last 3 results) No results for input(s): BNP in the last 8760 hours.  ProBNP (last 3 results) No results for input(s): PROBNP in the last 8760 hours.   Other Studies Reviewed Today:  Echo Study Conclusions from 10/2013  - Left ventricle: Septal apical and inferior wall hypokinesis. The cavity size was moderately dilated. Wall thickness was increased in a pattern of severe LVH. Systolic function was moderately to severely reduced. The estimated ejection fraction was in the range of 30% to 35%. - Aortic valve: There was very mild stenosis. - Mitral valve: Calcified annulus. There was mild regurgitation. - Left atrium: The atrium was moderately to severely dilated. - Right atrium: The atrium was mildly dilated. - Atrial septum: No defect or patent foramen ovale was identified.  Assessment/Plan: 1. Ischemic heart disease status post CABG originally in 1983 and then redo CABG in 1996. Ischemic cardiomyopathy with left ventricular dysfunction. No symptoms of heart failure.  2. Prior aortic valve replacement by Jeffrey. Evelina Dun at [facility] for severe aortic stenosis  3. Memory loss  4. Permanent atrial fibrillation on Coumadin  5. Functioning pacemaker followed by Jeffrey. Rayann Whitney  6. HTN - BP quite high today. I suspect this is more related to not taking his medicines appropriately. He is agreeable to letting his wife manage his medicines going forward. I will see back in 2 weeks.   Current medicines are reviewed with the patient today.  The patient does not have concerns regarding medicines other than what has been noted above.  The following  changes have been made:  See above.  Labs/ tests ordered today include:   No orders of the defined types were placed in this encounter.     Disposition:   FU with me in 2 weeks. Check BMET on return.  Patient is agreeable to this plan and will call if any problems develop in the interim.   Signed: Burtis Junes, RN, ANP-C 08/01/2015 11:32 AM  [place] [address] [place], [place]  [address] Phone: [phone] Fax: [phone]

## 2015-08-04 DIAGNOSIS — H2513 Age-related nuclear cataract, bilateral: Secondary | ICD-10-CM | POA: Diagnosis not present

## 2015-08-04 DIAGNOSIS — H401132 Primary open-angle glaucoma, bilateral, moderate stage: Secondary | ICD-10-CM | POA: Diagnosis not present

## 2015-08-15 ENCOUNTER — Ambulatory Visit (INDEPENDENT_AMBULATORY_CARE_PROVIDER_SITE_OTHER): Payer: Medicare Other

## 2015-08-15 ENCOUNTER — Other Ambulatory Visit: Payer: Self-pay | Admitting: Nurse Practitioner

## 2015-08-15 ENCOUNTER — Encounter: Payer: Self-pay | Admitting: Nurse Practitioner

## 2015-08-15 ENCOUNTER — Ambulatory Visit (INDEPENDENT_AMBULATORY_CARE_PROVIDER_SITE_OTHER): Payer: Medicare Other | Admitting: Nurse Practitioner

## 2015-08-15 VITALS — BP 170/102 | HR 68 | Ht 72.0 in | Wt 189.1 lb

## 2015-08-15 DIAGNOSIS — I482 Chronic atrial fibrillation, unspecified: Secondary | ICD-10-CM

## 2015-08-15 DIAGNOSIS — I255 Ischemic cardiomyopathy: Secondary | ICD-10-CM

## 2015-08-15 DIAGNOSIS — I10 Essential (primary) hypertension: Secondary | ICD-10-CM | POA: Diagnosis not present

## 2015-08-15 DIAGNOSIS — I4891 Unspecified atrial fibrillation: Secondary | ICD-10-CM | POA: Diagnosis not present

## 2015-08-15 LAB — BASIC METABOLIC PANEL
BUN: 25 mg/dL (ref 7–25)
CO2: 31 mmol/L (ref 20–31)
Calcium: 9 mg/dL (ref 8.6–10.3)
Chloride: 102 mmol/L (ref 98–110)
Creat: 1.09 mg/dL (ref 0.70–1.11)
Glucose, Bld: 90 mg/dL (ref 65–99)
Potassium: 4.2 mmol/L (ref 3.5–5.3)
Sodium: 142 mmol/L (ref 135–146)

## 2015-08-15 LAB — POCT INR: INR: 2.7

## 2015-08-15 MED ORDER — WARFARIN SODIUM 5 MG PO TABS: ORAL_TABLET | ORAL | Status: DC

## 2015-08-15 NOTE — Progress Notes (Signed)
CARDIOLOGY OFFICE NOTE  Date:  08/15/2015    Jeffrey Whitney Date of Birth: 23-Jun-1930 Medical Record [id number]  PCP:  Jeffrey Gravel, MD  Cardiologist:  Jeffrey Whitney    Chief Complaint  Patient presents with  . Hypertension  . Atrial Fibrillation  . Coronary Artery Disease    1 month check - former patient of Dr. Sherryl Whitney. Seen for Dr. Rayann Whitney    History of Present Illness: Jeffrey Whitney is a 82 y.o. male who presents today for a one month check. Seen for Dr. Rayann Whitney. Former patient of Dr. Sherryl Whitney and Dr. Susa Whitney.   He has a history of CAD with prior CABG initially in 1983 with redo CABG in 1996, prior AVR at [facility]. Other issues include chronic atrial fib, CHB with PPM in place - followed by Dr. Rayann Whitney. HLD and HTN.  A stress test in June 2011 showed no ischemia and his ejection fraction was 42%. Echo from 2015 with EF of 30 to 35%. He has had some progressive memory issues.   Last seen here in September by Dr. Mare Whitney. Saw Dr. Rayann Whitney in August.   I saw him last month - he was quite hypertensive. Diovan was increased. He was agreeable to letting his wife manage all of his medicines. He has had some progressive dementia.  Comes back today. Here with his wife. Doing ok. Not dizzy or lightheaded. He probably gets too much salt - likes to eat out - especially at the Jeffrey Whitney places and Jeffrey Whitney. No chest pain. BP readings from home are ok. Higher here today. Has broken off a tooth and headed to the dentist office next.    Past Medical History  Diagnosis Date  . Complete heart block (Jeffrey Whitney) 05/24/2005    Pacemaker (MDT) implanted by Dr Jeffrey Whitney  . CAD (coronary artery disease)     s/p CABG  . PVD (peripheral vascular disease) ([place]. [place])     s/p remote bilateral femoral bypass at Jeffrey Whitney, CEA at Jeffrey [facility]  . HTN (hypertension)   . DM (diabetes mellitus) ([place])   . Peripheral autonomic neuropathy due to DM ([place])   . Venous insufficiency   . S/P AVR (aortic valve replacement)    mechanical valve  . A-fib ([facility])     Permanent afib  . Hyperlipidemia     Past Surgical History  Procedure Laterality Date  . Pacemaker insertion  05/24/2005    MDT by Dr Jeffrey Whitney  . Coronary artery bypass graft      at [facility]  . Carotid endarterectomy  06/25/10    at [facility]  . Femoral artery - femoral artery bypass graft      remote  . R carotid stent placement  1/13    at Jeffrey Whitney  . Pacemaker generator change  05/18/12    MDT Jeffrey Whitney implanted by Dr Jeffrey Whitney  . Pacemaker generator change N/A 05/18/2012    Procedure: PACEMAKER GENERATOR CHANGE;  Surgeon: Jeffrey Grayer, MD;  Location: [facility];  Service: Cardiovascular;  Laterality: N/A;     Medications: Current Outpatient Prescriptions  Medication Sig Dispense Refill  . acetaminophen (TYLENOL) 500 MG tablet Take 500 mg by mouth every 6 (six) hours as needed for moderate pain.    Jeffrey Kitchen aspirin 81 MG tablet Take 81 mg by mouth daily.     Jeffrey Kitchen atorvastatin (LIPITOR) 40 MG tablet Take 40 mg by mouth at bedtime.      . bimatoprost (LUMIGAN) 0.03 % ophthalmic drops Place 1 drop into both  eyes at bedtime.      . folic acid (FOLVITE) 1 MG tablet Take 1 mg by mouth daily.      . furosemide (LASIX) 40 MG tablet Take 40 mg by mouth daily.     . metFORMIN (GLUCOPHAGE) 500 MG tablet Take 1,000 mg by mouth daily.     . metoprolol (LOPRESSOR) 50 MG tablet Take 25 mg by mouth 2 (two) times daily.     . Multiple Vitamin (MULTIVITAMIN) tablet Take 1 tablet by mouth daily.      . QUEtiapine Fumarate (SEROQUEL PO) Take 1 capsule by mouth daily. Reported on 07/31/2015    . timolol (TIMOPTIC) 0.5 % ophthalmic solution Place 1 drop into both eyes daily.    . traMADol (ULTRAM) 50 MG tablet Take 50 mg by mouth every 6 (six) hours as needed. For pain    . valsartan (DIOVAN) 320 MG tablet Take 1 tablet (320 mg total) by mouth daily. 90 tablet 3  . vitamin D, CHOLECALCIFEROL, 400 UNITS tablet Take 400 Units by mouth daily.      Jeffrey Kitchen warfarin (COUMADIN) 5 MG tablet  TAKE AS DIRECTED BY COUMADIN CLINIC 40 tablet 3   No current facility-administered medications for this visit.    Allergies: Allergies  Allergen Reactions  . Aricept [Donepezil Hcl] Diarrhea    Social History: The patient  reports that he quit smoking about 21 years ago. His smoking use included Cigarettes. He quit after 20 years of use. He does not have any smokeless tobacco history on file. He reports that he does not drink alcohol or use illicit drugs.   Family History: The patient's family history includes Diabetes in his brother; Pulmonary embolism in his mother.   Review of Systems: Please see the history of present illness.   Otherwise, the review of systems is positive for none.   All other systems are reviewed and negative.   Physical Exam: VS:  BP 170/102 mmHg  Pulse 68  Ht 6' (1.829 m)  Wt 189 lb 1.9 oz (85.784 kg)  BMI 25.64 kg/m2 .  BMI Body mass index is 25.64 kg/(m^2).  Wt Readings from Last 3 Encounters:  08/15/15 189 lb 1.9 oz (85.784 kg)  08/01/15 191 lb 12.8 oz (87 kg)  07/31/15 191 lb 8 oz (86.864 kg)   Repeat BP by me is 150/70.  General: Pleasant. Well developed, well nourished and in no acute distress.  HEENT: Normal but with poor dentition. Neck: Supple, no JVD, carotid bruits, or masses noted.  Cardiac: Fairly regular rate and rhythm. No murmurs, rubs, or gallops. No edema.  Respiratory:  Lungs are clear to auscultation bilaterally with normal work of breathing.  GI: Soft and nontender.  MS: No deformity or atrophy. Gait and ROM intact. Skin: Warm and dry. Color is normal.  Neuro:  Strength and sensation are intact and no gross focal deficits noted.  Psych: Alert, appropriate and with normal affect.   LABORATORY DATA:  EKG:  EKG is not ordered today.  Whitney Results  Component Value Date   WBC 9.7 11/28/2013   HGB 15.4 11/28/2013   HCT 44.8 11/28/2013   PLT 102* 11/28/2013   GLUCOSE 154* 11/28/2013   ALT 24 11/28/2013   AST 29 11/28/2013    NA 142 11/28/2013   K 4.2 11/28/2013   CL 105 11/28/2013   CREATININE 0.89 11/28/2013   BUN 17 11/28/2013   CO2 24 11/28/2013   TSH 0.81 11/16/2013   INR 3.1 07/17/2015  BNP (last 3 results) No results for input(s): BNP in the last 8760 hours.  ProBNP (last 3 results) No results for input(s): PROBNP in the last 8760 hours.   Other Studies Reviewed Today:  Echo Study Conclusions from 10/2013  - Left ventricle: Septal apical and inferior wall hypokinesis. The cavity size was moderately dilated. Wall thickness was increased in a pattern of severe LVH. Systolic function was moderately to severely reduced. The estimated ejection fraction was in the range of 30% to 35%. - Aortic valve: There was very mild stenosis. - Mitral valve: Calcified annulus. There was mild regurgitation. - Left atrium: The atrium was moderately to severely dilated. - Right atrium: The atrium was mildly dilated. - Atrial septum: No defect or patent foramen ovale was identified.  Assessment/Plan: 1. Ischemic heart disease status post CABG originally in 1983 and then redo CABG in 1996. Ischemic cardiomyopathy with left ventricular dysfunction. No symptoms of heart failure.  2. Prior aortic valve replacement by Dr. Evelina Dun at [facility] for severe aortic stenosis  3. Memory loss  4. Permanent atrial fibrillation on Coumadin  5. Functioning pacemaker followed by Dr. Rayann Whitney  6. HTN - ARB was increased at last visit. Recheck BMET today. Will have wife continue to oversee all of his medicines and monitor BP. See back as planned later this summer.  Current medicines are reviewed with the patient today.  The patient does not have concerns regarding medicines other than what has been noted above.  The following changes have been made:  See above.  Labs/ tests ordered today include:    Orders Placed This Encounter  Procedures  . Basic metabolic panel     Disposition:   FU with Dr. Rayann Whitney and  his team in August.   Patient is agreeable to this plan and will call if any problems develop in the interim.   Signed: Burtis Junes, RN, ANP-C 08/15/2015 11:55 AM  [place] [address] [place], [place]  [address] Phone: [phone] Fax: [phone]

## 2015-08-15 NOTE — Patient Instructions (Addendum)
We will be checking the following labs today - BMET   Medication Instructions:    Continue with your current medicines.     Testing/Procedures To Be Arranged:  N/A  Follow-Up:   See  Dr. Rayann Heman and his team in August as planned.    Other Special Instructions:   Keep a check on the blood pressure and keep a diary to bring to your next visit    If you need a refill on your cardiac medications before your next appointment, please call your pharmacy.   Call the [place] office at [phone] if you have any questions, problems or concerns.

## 2015-09-05 DIAGNOSIS — H04123 Dry eye syndrome of bilateral lacrimal glands: Secondary | ICD-10-CM | POA: Diagnosis not present

## 2015-09-05 DIAGNOSIS — H401112 Primary open-angle glaucoma, right eye, moderate stage: Secondary | ICD-10-CM | POA: Diagnosis not present

## 2015-09-05 DIAGNOSIS — H401122 Primary open-angle glaucoma, left eye, moderate stage: Secondary | ICD-10-CM | POA: Diagnosis not present

## 2015-09-05 DIAGNOSIS — H16103 Unspecified superficial keratitis, bilateral: Secondary | ICD-10-CM | POA: Diagnosis not present

## 2015-09-22 ENCOUNTER — Ambulatory Visit (INDEPENDENT_AMBULATORY_CARE_PROVIDER_SITE_OTHER): Payer: Medicare Other | Admitting: *Deleted

## 2015-09-22 DIAGNOSIS — I482 Chronic atrial fibrillation, unspecified: Secondary | ICD-10-CM

## 2015-09-22 DIAGNOSIS — I4891 Unspecified atrial fibrillation: Secondary | ICD-10-CM | POA: Diagnosis not present

## 2015-09-22 LAB — POCT INR: INR: 3.1

## 2015-09-25 DIAGNOSIS — I10 Essential (primary) hypertension: Secondary | ICD-10-CM | POA: Diagnosis not present

## 2015-09-25 DIAGNOSIS — E7800 Pure hypercholesterolemia, unspecified: Secondary | ICD-10-CM | POA: Diagnosis not present

## 2015-09-25 DIAGNOSIS — E139 Other specified diabetes mellitus without complications: Secondary | ICD-10-CM | POA: Diagnosis not present

## 2015-09-25 DIAGNOSIS — E039 Hypothyroidism, unspecified: Secondary | ICD-10-CM | POA: Diagnosis not present

## 2015-10-01 DIAGNOSIS — R413 Other amnesia: Secondary | ICD-10-CM | POA: Diagnosis not present

## 2015-10-01 DIAGNOSIS — E039 Hypothyroidism, unspecified: Secondary | ICD-10-CM | POA: Diagnosis not present

## 2015-10-01 DIAGNOSIS — I480 Paroxysmal atrial fibrillation: Secondary | ICD-10-CM | POA: Diagnosis not present

## 2015-10-01 DIAGNOSIS — I2510 Atherosclerotic heart disease of native coronary artery without angina pectoris: Secondary | ICD-10-CM | POA: Diagnosis not present

## 2015-10-20 ENCOUNTER — Ambulatory Visit (INDEPENDENT_AMBULATORY_CARE_PROVIDER_SITE_OTHER): Payer: Medicare Other | Admitting: *Deleted

## 2015-10-20 DIAGNOSIS — I482 Chronic atrial fibrillation, unspecified: Secondary | ICD-10-CM

## 2015-10-20 DIAGNOSIS — I4891 Unspecified atrial fibrillation: Secondary | ICD-10-CM | POA: Diagnosis not present

## 2015-10-20 LAB — POCT INR: INR: 3.7

## 2015-11-03 ENCOUNTER — Ambulatory Visit (INDEPENDENT_AMBULATORY_CARE_PROVIDER_SITE_OTHER): Payer: Medicare Other | Admitting: *Deleted

## 2015-11-03 DIAGNOSIS — I4891 Unspecified atrial fibrillation: Secondary | ICD-10-CM | POA: Diagnosis not present

## 2015-11-03 DIAGNOSIS — I482 Chronic atrial fibrillation, unspecified: Secondary | ICD-10-CM

## 2015-11-03 LAB — POCT INR: INR: 3.7

## 2015-11-05 ENCOUNTER — Encounter: Payer: Self-pay | Admitting: Nurse Practitioner

## 2015-11-17 ENCOUNTER — Ambulatory Visit (INDEPENDENT_AMBULATORY_CARE_PROVIDER_SITE_OTHER): Payer: Medicare Other

## 2015-11-17 DIAGNOSIS — I482 Chronic atrial fibrillation, unspecified: Secondary | ICD-10-CM

## 2015-11-17 DIAGNOSIS — I4891 Unspecified atrial fibrillation: Secondary | ICD-10-CM

## 2015-11-17 LAB — POCT INR: INR: 2.3

## 2015-12-10 NOTE — Progress Notes (Signed)
Electrophysiology Office Note Date: 12/11/2015  ID:  ERHARDT Whitney, DOB 08/28/1930, MRN [id number]  PCP: Jeffrey Gravel, MD Primary Cardiologist: Jeffrey Merle, NP Electrophysiologist: Jeffrey Whitney  CC: Pacemaker follow-up  Jeffrey Whitney is a 82 y.o. male seen today for Dr Jeffrey Whitney.  He presents today for routine electrophysiology followup.  Since last being seen in our clinic, the patient reports doing reasonably well.  He is fairly sedentary at home.  His PCP started him on Namenda which has resulted in better appetite and him feeling like he has more energy. His wife is unsure if it is helping his memory.   He denies chest pain, palpitations, dyspnea, PND, orthopnea, nausea, vomiting, dizziness, syncope, edema, weight gain, or early satiety.  Device History: MDT dual chamber PPM implanted 2007 for complete heart block; gen change 2014   Past Medical History:  Diagnosis Date  . A-fib (HCC)    Permanent afib  . CAD (coronary artery disease)    s/p CABG  . Complete heart block ([place]) 05/24/2005   Pacemaker (MDT) implanted by Dr Jeffrey Whitney  . DM (diabetes mellitus) ([place])   . HTN (hypertension)   . Hyperlipidemia   . Peripheral autonomic neuropathy due to DM ([place])   . PVD (peripheral vascular disease) ([place])    s/p remote bilateral femoral bypass at [facility], [facility] at [facility]  . S/P AVR (aortic valve replacement)    mechanical valve  . Venous insufficiency    Past Surgical History:  Procedure Laterality Date  . CAROTID ENDARTERECTOMY  06/25/10   at [place]     at [facility]  . FEMORAL ARTERY - FEMORAL ARTERY BYPASS GRAFT     remote  . PACEMAKER GENERATOR CHANGE  05/18/12   MDT Jeffrey Whitney SR implanted by Dr Jeffrey Whitney  . PACEMAKER GENERATOR CHANGE N/A 05/18/2012   Procedure: PACEMAKER GENERATOR CHANGE;  Surgeon: Jeffrey Grayer, MD;  Location: [facility] CATH LAB;  Service: Cardiovascular;  Laterality: N/A;  . PACEMAKER INSERTION  05/24/2005   MDT by Dr Jeffrey Whitney  . R carotid stent  placement  1/13   at [facility]    Current Outpatient Prescriptions  Medication Sig Dispense Refill  . acetaminophen (TYLENOL) 500 MG tablet Take 500 mg by mouth every 6 (six) hours as needed for moderate pain.    Marland Kitchen aspirin 81 MG tablet Take 81 mg by mouth daily.     Marland Kitchen atorvastatin (LIPITOR) 40 MG tablet Take 40 mg by mouth at bedtime.      . bimatoprost (LUMIGAN) 0.03 % ophthalmic drops Place 1 drop into both eyes at bedtime.      . donepezil (ARICEPT) 5 MG tablet     . folic acid (FOLVITE) 1 MG tablet Take 1 mg by mouth daily.      . furosemide (LASIX) 40 MG tablet Take 40 mg by mouth daily.     . memantine (NAMENDA) 10 MG tablet     . metFORMIN (GLUCOPHAGE) 500 MG tablet Take 1,000 mg by mouth daily.     . metoprolol (LOPRESSOR) 50 MG tablet Take 25 mg by mouth 2 (two) times daily.     . Multiple Vitamin (MULTIVITAMIN) tablet Take 1 tablet by mouth daily.      . QUEtiapine Fumarate (SEROQUEL PO) Take 1 capsule by mouth daily. Reported on 07/31/2015    . timolol (TIMOPTIC) 0.5 % ophthalmic solution Place 1 drop into both eyes daily.    . traMADol (ULTRAM) 50 MG tablet Take 50 mg  by mouth every 6 (six) hours as needed. For pain    . valsartan (DIOVAN) 160 MG tablet     . vitamin D, CHOLECALCIFEROL, 400 UNITS tablet Take 400 Units by mouth daily.      Marland Kitchen warfarin (COUMADIN) 5 MG tablet TAKE AS DIRECTED BY COUMADIN CLINIC 40 tablet 3   No current facility-administered medications for this visit.     Allergies:   Aricept [donepezil hcl]   Social History: Social History   Social History  . Marital status: Married    Spouse name: N/A  . Number of children: N/A  . Years of education: N/A   Occupational History  . Not on file.   Social History Main Topics  . Smoking status: Former Smoker    Years: 20.00    Types: Cigarettes    Quit date: 07/15/1994  . Smokeless tobacco: Not on file  . Alcohol use No  . Drug use: No  . Sexual activity: Not on file   Other Topics Concern  . Not  on file   Social History Narrative  . No narrative on file    Family History: Family History  Problem Relation Age of Onset  . Pulmonary embolism Mother   . Diabetes Brother   . Coronary artery disease    . Heart attack Neg Hx      Review of Systems: All other systems reviewed and are otherwise negative except as noted above.   Physical Exam: VS:  BP 110/80   Pulse 75   Ht 6' (1.829 m)   Wt 188 lb 9.6 oz (85.5 kg)   BMI 25.58 kg/m  , BMI Body mass index is 25.58 kg/m.  GEN- The patient is elderly appearing, alert and oriented x 3 today.   HEENT: normocephalic, atraumatic; sclera clear, conjunctiva pink; hearing intact; oropharynx clear; neck supple  Lungs- Clear to ausculation bilaterally, normal work of breathing.  No wheezes, rales, rhonchi Heart- Regular rate and rhythm (paced) GI- soft, non-tender, non-distended, bowel sounds present  Extremities- no clubbing, cyanosis, or edema; DP/PT/radial pulses 2+ bilaterally MS- no significant deformity or atrophy Skin- warm and dry, no rash or lesion; PPM pocket well healed Psych- euthymic mood, full affect Neuro- strength and sensation are intact  PPM Interrogation- reviewed in detail today,  See PACEART report  EKG:  EKG is ordered today. EKG today demonstrates atrial fibrillation with ventricular pacing   Recent Labs: 08/15/2015: BUN 25; Creat 1.09; Potassium 4.2; Sodium 142   Wt Readings from Last 3 Encounters:  12/11/15 188 lb 9.6 oz (85.5 kg)  08/15/15 189 lb 1.9 oz (85.8 kg)  08/01/15 191 lb 12.8 oz (87 kg)     Other studies Reviewed: Additional studies/ records that were reviewed today include: Dr Jeffrey Whitney office notes  Assessment and Plan:  1.  Complete heart block  Normal PPM function See Pace Art report No changes today  2.  Permanent atrial fibrillation V rates controlled with device dependence Continue Warfarin for CHADS2VASC of 6 No bleeding issues   3. CAD s/p CABG No recent ischemic  symptoms Continue medical therapy  4.  HTN Stable No change required today  5.  Ischemic cardiomyopathy/chronic systolic heart failure Euvolemic on exam Continue current therapy  Current medicines are reviewed at length with the patient today.   The patient does not have concerns regarding his medicines.  The following changes were made today:  none  Labs/ tests ordered today include: none No orders of the defined types were placed  in this encounter.    Disposition:   Follow up with Cecille Rubin and device clinic in 6 months, Dr Jeffrey Whitney 1 year    Signed, Chanetta Marshall, NP 12/11/2015 11:49 AM  [facility] [address] [place] [address] [phone] (office) [phone] (fax

## 2015-12-11 ENCOUNTER — Encounter: Payer: Self-pay | Admitting: Internal Medicine

## 2015-12-11 ENCOUNTER — Ambulatory Visit (INDEPENDENT_AMBULATORY_CARE_PROVIDER_SITE_OTHER): Payer: Medicare Other | Admitting: *Deleted

## 2015-12-11 ENCOUNTER — Ambulatory Visit (INDEPENDENT_AMBULATORY_CARE_PROVIDER_SITE_OTHER): Payer: Medicare Other | Admitting: Nurse Practitioner

## 2015-12-11 ENCOUNTER — Encounter: Payer: Self-pay | Admitting: Nurse Practitioner

## 2015-12-11 VITALS — BP 110/80 | HR 75 | Ht 72.0 in | Wt 188.6 lb

## 2015-12-11 DIAGNOSIS — I442 Atrioventricular block, complete: Secondary | ICD-10-CM

## 2015-12-11 DIAGNOSIS — I255 Ischemic cardiomyopathy: Secondary | ICD-10-CM

## 2015-12-11 DIAGNOSIS — I482 Chronic atrial fibrillation: Secondary | ICD-10-CM

## 2015-12-11 DIAGNOSIS — I4891 Unspecified atrial fibrillation: Secondary | ICD-10-CM | POA: Diagnosis not present

## 2015-12-11 DIAGNOSIS — I4821 Permanent atrial fibrillation: Secondary | ICD-10-CM

## 2015-12-11 DIAGNOSIS — I110 Hypertensive heart disease with heart failure: Secondary | ICD-10-CM | POA: Diagnosis not present

## 2015-12-11 LAB — POCT INR: INR: 2.2

## 2015-12-11 NOTE — Patient Instructions (Signed)
Medication Instructions: Your physician recommends that you continue on your current medications as directed. Please refer to the Current Medication list given to you today.   Labwork: NONE ORDERED  Procedures/Testing: NONE ORDERED  Follow-Up: Your physician wants you to follow-up in: 6 months with Denice Bors, NP and the device clinic (same day) and 1 year with Dr. Rayann Heman. You will receive a reminder letter in the mail two months in advance. If you don't receive a letter, please call our office to schedule the follow-up appointment.    Any Additional Special Instructions Will Be Listed Below (If Applicable).     If you need a refill on your cardiac medications before your next appointment, please call your pharmacy.

## 2015-12-12 LAB — CUP PACEART INCLINIC DEVICE CHECK
MDC IDC LEAD IMPLANT DT: 20070122
MDC IDC LEAD LOCATION: 753860
MDC IDC SESS DTM: 20170811080257

## 2015-12-15 ENCOUNTER — Encounter: Payer: Medicare Other | Admitting: Nurse Practitioner

## 2015-12-24 DIAGNOSIS — E039 Hypothyroidism, unspecified: Secondary | ICD-10-CM | POA: Diagnosis not present

## 2015-12-24 DIAGNOSIS — E119 Type 2 diabetes mellitus without complications: Secondary | ICD-10-CM | POA: Diagnosis not present

## 2015-12-24 DIAGNOSIS — I2510 Atherosclerotic heart disease of native coronary artery without angina pectoris: Secondary | ICD-10-CM | POA: Diagnosis not present

## 2015-12-24 DIAGNOSIS — I10 Essential (primary) hypertension: Secondary | ICD-10-CM | POA: Diagnosis not present

## 2015-12-31 DIAGNOSIS — Z125 Encounter for screening for malignant neoplasm of prostate: Secondary | ICD-10-CM | POA: Diagnosis not present

## 2015-12-31 DIAGNOSIS — E119 Type 2 diabetes mellitus without complications: Secondary | ICD-10-CM | POA: Diagnosis not present

## 2015-12-31 DIAGNOSIS — I10 Essential (primary) hypertension: Secondary | ICD-10-CM | POA: Diagnosis not present

## 2015-12-31 DIAGNOSIS — E7800 Pure hypercholesterolemia, unspecified: Secondary | ICD-10-CM | POA: Diagnosis not present

## 2016-01-06 ENCOUNTER — Other Ambulatory Visit: Payer: Self-pay

## 2016-01-08 ENCOUNTER — Ambulatory Visit (INDEPENDENT_AMBULATORY_CARE_PROVIDER_SITE_OTHER): Payer: Medicare Other | Admitting: *Deleted

## 2016-01-08 DIAGNOSIS — I4891 Unspecified atrial fibrillation: Secondary | ICD-10-CM

## 2016-01-08 LAB — POCT INR: INR: 2.5

## 2016-01-15 DIAGNOSIS — H2513 Age-related nuclear cataract, bilateral: Secondary | ICD-10-CM | POA: Diagnosis not present

## 2016-01-15 DIAGNOSIS — H401132 Primary open-angle glaucoma, bilateral, moderate stage: Secondary | ICD-10-CM | POA: Diagnosis not present

## 2016-02-10 ENCOUNTER — Other Ambulatory Visit: Payer: Self-pay | Admitting: Internal Medicine

## 2016-02-25 ENCOUNTER — Ambulatory Visit (INDEPENDENT_AMBULATORY_CARE_PROVIDER_SITE_OTHER): Payer: Medicare Other | Admitting: *Deleted

## 2016-02-25 DIAGNOSIS — I4891 Unspecified atrial fibrillation: Secondary | ICD-10-CM | POA: Diagnosis not present

## 2016-02-25 LAB — POCT INR: INR: 3.7

## 2016-03-10 ENCOUNTER — Other Ambulatory Visit: Payer: Self-pay | Admitting: *Deleted

## 2016-03-10 MED ORDER — WARFARIN SODIUM 5 MG PO TABS: ORAL_TABLET | ORAL | 0 refills | Status: DC

## 2016-03-15 ENCOUNTER — Ambulatory Visit (INDEPENDENT_AMBULATORY_CARE_PROVIDER_SITE_OTHER): Payer: Medicare Other | Admitting: *Deleted

## 2016-03-15 DIAGNOSIS — I255 Ischemic cardiomyopathy: Secondary | ICD-10-CM

## 2016-03-15 DIAGNOSIS — I4891 Unspecified atrial fibrillation: Secondary | ICD-10-CM | POA: Diagnosis not present

## 2016-03-15 LAB — POCT INR: INR: 2.7

## 2016-04-05 ENCOUNTER — Other Ambulatory Visit: Payer: Self-pay | Admitting: *Deleted

## 2016-04-05 ENCOUNTER — Ambulatory Visit (INDEPENDENT_AMBULATORY_CARE_PROVIDER_SITE_OTHER): Payer: Medicare Other | Admitting: *Deleted

## 2016-04-05 DIAGNOSIS — I4891 Unspecified atrial fibrillation: Secondary | ICD-10-CM

## 2016-04-05 DIAGNOSIS — I255 Ischemic cardiomyopathy: Secondary | ICD-10-CM | POA: Diagnosis not present

## 2016-04-05 LAB — POCT INR: INR: 3.4

## 2016-04-05 MED ORDER — WARFARIN SODIUM 5 MG PO TABS: ORAL_TABLET | ORAL | 1 refills | Status: DC

## 2016-04-19 ENCOUNTER — Ambulatory Visit (INDEPENDENT_AMBULATORY_CARE_PROVIDER_SITE_OTHER): Payer: Medicare Other | Admitting: Pharmacist

## 2016-04-19 DIAGNOSIS — I255 Ischemic cardiomyopathy: Secondary | ICD-10-CM | POA: Diagnosis not present

## 2016-04-19 DIAGNOSIS — I4891 Unspecified atrial fibrillation: Secondary | ICD-10-CM | POA: Diagnosis not present

## 2016-04-19 LAB — POCT INR: INR: 4.2

## 2016-05-04 ENCOUNTER — Ambulatory Visit (INDEPENDENT_AMBULATORY_CARE_PROVIDER_SITE_OTHER): Payer: Medicare Other | Admitting: *Deleted

## 2016-05-04 DIAGNOSIS — I4891 Unspecified atrial fibrillation: Secondary | ICD-10-CM | POA: Diagnosis not present

## 2016-05-04 LAB — POCT INR: INR: 2.2

## 2016-05-13 DIAGNOSIS — H25813 Combined forms of age-related cataract, bilateral: Secondary | ICD-10-CM | POA: Diagnosis not present

## 2016-05-13 DIAGNOSIS — H52201 Unspecified astigmatism, right eye: Secondary | ICD-10-CM | POA: Diagnosis not present

## 2016-05-13 DIAGNOSIS — H401132 Primary open-angle glaucoma, bilateral, moderate stage: Secondary | ICD-10-CM | POA: Diagnosis not present

## 2016-05-13 DIAGNOSIS — E119 Type 2 diabetes mellitus without complications: Secondary | ICD-10-CM | POA: Diagnosis not present

## 2016-05-25 ENCOUNTER — Ambulatory Visit (INDEPENDENT_AMBULATORY_CARE_PROVIDER_SITE_OTHER): Payer: Medicare Other | Admitting: *Deleted

## 2016-05-25 DIAGNOSIS — I4891 Unspecified atrial fibrillation: Secondary | ICD-10-CM | POA: Diagnosis not present

## 2016-05-25 LAB — POCT INR: INR: 2.6

## 2016-06-21 ENCOUNTER — Encounter (INDEPENDENT_AMBULATORY_CARE_PROVIDER_SITE_OTHER): Payer: Self-pay

## 2016-06-21 ENCOUNTER — Ambulatory Visit (INDEPENDENT_AMBULATORY_CARE_PROVIDER_SITE_OTHER): Payer: Medicare Other | Admitting: *Deleted

## 2016-06-21 ENCOUNTER — Ambulatory Visit (INDEPENDENT_AMBULATORY_CARE_PROVIDER_SITE_OTHER): Payer: Medicare Other | Admitting: Nurse Practitioner

## 2016-06-21 ENCOUNTER — Encounter: Payer: Self-pay | Admitting: Nurse Practitioner

## 2016-06-21 VITALS — BP 106/62 | HR 76 | Ht 72.0 in | Wt 171.8 lb

## 2016-06-21 DIAGNOSIS — I110 Hypertensive heart disease with heart failure: Secondary | ICD-10-CM | POA: Diagnosis not present

## 2016-06-21 DIAGNOSIS — I482 Chronic atrial fibrillation: Secondary | ICD-10-CM

## 2016-06-21 DIAGNOSIS — I442 Atrioventricular block, complete: Secondary | ICD-10-CM | POA: Diagnosis not present

## 2016-06-21 DIAGNOSIS — E7800 Pure hypercholesterolemia, unspecified: Secondary | ICD-10-CM | POA: Diagnosis not present

## 2016-06-21 DIAGNOSIS — I4891 Unspecified atrial fibrillation: Secondary | ICD-10-CM | POA: Diagnosis not present

## 2016-06-21 DIAGNOSIS — I255 Ischemic cardiomyopathy: Secondary | ICD-10-CM | POA: Diagnosis not present

## 2016-06-21 DIAGNOSIS — I4821 Permanent atrial fibrillation: Secondary | ICD-10-CM

## 2016-06-21 DIAGNOSIS — I2589 Other forms of chronic ischemic heart disease: Secondary | ICD-10-CM

## 2016-06-21 LAB — CUP PACEART INCLINIC DEVICE CHECK
Brady Statistic RV Percent Paced: 100 %
Date Time Interrogation Session: 20180219160320
Implantable Lead Location: 753860
Lead Channel Impedance Value: 0 Ohm
Lead Channel Pacing Threshold Amplitude: 0.5 V
Lead Channel Pacing Threshold Pulse Width: 0.4 ms
MDC IDC LEAD IMPLANT DT: 20070122
MDC IDC MSMT BATTERY IMPEDANCE: 1046 Ohm
MDC IDC MSMT BATTERY REMAINING LONGEVITY: 55 mo
MDC IDC MSMT BATTERY VOLTAGE: 2.77 V
MDC IDC MSMT LEADCHNL RV IMPEDANCE VALUE: 661 Ohm
MDC IDC MSMT LEADCHNL RV PACING THRESHOLD AMPLITUDE: 0.625 V
MDC IDC MSMT LEADCHNL RV PACING THRESHOLD PULSEWIDTH: 0.4 ms
MDC IDC PG IMPLANT DT: 20140116
MDC IDC SET LEADCHNL RV PACING AMPLITUDE: 2.5 V
MDC IDC SET LEADCHNL RV PACING PULSEWIDTH: 0.4 ms
MDC IDC SET LEADCHNL RV SENSING SENSITIVITY: 5.6 mV

## 2016-06-21 LAB — POCT INR: INR: 2

## 2016-06-21 MED ORDER — WARFARIN SODIUM 5 MG PO TABS: ORAL_TABLET | ORAL | 1 refills | Status: AC

## 2016-06-21 MED ORDER — VALSARTAN 80 MG PO TABS: 80.0000 mg | ORAL_TABLET | Freq: Every day | ORAL | 6 refills | Status: AC

## 2016-06-21 NOTE — Progress Notes (Signed)
Pacemaker check in clinic. Normal device function. Threshold, sensing, impedance consistent with previous measurements. Device programmed to maximize longevity. 4 high ventricular rates noted- longest 8 seconds @ 213bpm. Chronic AF- on warfarin. Device programmed at appropriate safety margins. Histogram distribution appropriate for patient activity level. Device programmed to optimize intrinsic conduction. Estimated longevity 3.5-5.5 years. ROV with Dr. Rayann Heman in August.

## 2016-06-21 NOTE — Patient Instructions (Addendum)
We will be checking the following labs today - NONE   Medication Instructions:    Continue with your current medicines. BUT  I am changing the Lasix to use only if needed - this would be for swelling, weight gain, etc.   I am cutting the Diovan back to just 80 mg a day - I sent this RX to the drug store if you cannot cut the 160mg   In half.     Testing/Procedures To Be Arranged:  N/A  Follow-Up:   See Dr. Rayann Heman in August  See your PCP as planned in early March - may need to stop the Diovan altogether at that time.     Other Special Instructions:   Let us know if you have more frequent falling.     If you need a refill on your cardiac medications before your next appointment, please call your pharmacy.   Call the [place] office at [phone] if you have any questions, problems or concerns.

## 2016-06-21 NOTE — Progress Notes (Signed)
CARDIOLOGY OFFICE NOTE  Date:  06/21/2016    Jeffrey Whitney Date of Birth: 27-Aug-1930 Medical Record [id number]  PCP:  Jeffrey Gravel, MD  Cardiologist:  [facility]   Chief Complaint  Patient presents with  . Coronary Artery Disease  . Cardiac Valve Problem  . Hypertension  . Hyperlipidemia    6 month check -seen for Dr. Rayann Heman    History of Present Illness: Jeffrey Whitney is a 83 y.o. male who presents today for a 6 month check. Seen for Dr. Rayann Heman. Former patient of Dr. Sherryl Barters and Dr. Susa Simmonds.   He has a history of CAD with prior CABG initially in 1983 with redo CABG in 1996, prior AVR at [facility]. Other issues include chronic atrial fib, CHB with PPM in place - followed by Dr. Rayann Heman. HLD and HTN.  A stress test in June 2011 showed no ischemia and his ejection fraction was 42%. Echo from 2015 with EF of 30 to 35%. He has had some progressive memory issues.   Last seen here in September of 2016 by Dr. Mare Ferrari. Saw Dr. Rayann Heman in August of 2016.   I saw him back in March of 2017 -  he was quite hypertensive. Diovan was increased. He was agreeable to letting his wife manage all of his medicines. He has had some progressive dementia. Loves salt and they eat out most meals. Saw Chanetta Marshall for [facility] check last August.   Comes back today. Here with his wife. He has really declined since I last saw him. Has lost almost  20 pounds of weight. She is trying her best to get him to eat/drink. Really not restricting anything in regards to food. BP much lower. No swelling. No chest pain. Breathing is ok. He has had a couple of falls - fortunately, no injury. Remains on coumadin - little up and down - probably related to his diet/eating habits. She feels like he is doing ok for the most part. He does sleep most of the day.   Past Medical History:  Diagnosis Date  . A-fib (HCC)    Permanent afib  . CAD (coronary artery disease)    s/p CABG  . Complete heart block ([place])  05/24/2005   Pacemaker (MDT) implanted by Dr Verlon Setting  . DM (diabetes mellitus) ([place])   . HTN (hypertension)   . Hyperlipidemia   . Peripheral autonomic neuropathy due to DM ([place])   . PVD (peripheral vascular disease) ([place])    s/p remote bilateral femoral bypass at [facility], CEA at [facility]  . S/P AVR (aortic valve replacement)    mechanical valve  . Venous insufficiency     Past Surgical History:  Procedure Laterality Date  . CAROTID ENDARTERECTOMY  06/25/10   at [place]     at [facility]  . FEMORAL ARTERY - FEMORAL ARTERY BYPASS GRAFT     remote  . PACEMAKER GENERATOR CHANGE  05/18/12   MDT Sherril Croon SR implanted by Dr Rayann Heman  . PACEMAKER GENERATOR CHANGE N/A 05/18/2012   Procedure: PACEMAKER GENERATOR CHANGE;  Surgeon: Thompson Grayer, MD;  Location: [facility] CATH LAB;  Service: Cardiovascular;  Laterality: N/A;  . PACEMAKER INSERTION  05/24/2005   MDT by Dr Verlon Setting  . R carotid stent placement  1/13   at [facility]     Medications: Current Outpatient Prescriptions  Medication Sig Dispense Refill  . acetaminophen (TYLENOL) 500 MG tablet Take 500 mg by mouth every 6 (six) hours  as needed for moderate pain.    Marland Kitchen aspirin 81 MG tablet Take 81 mg by mouth daily.     Marland Kitchen atorvastatin (LIPITOR) 40 MG tablet Take 40 mg by mouth at bedtime.      . bimatoprost (LUMIGAN) 0.03 % ophthalmic drops Place 1 drop into both eyes at bedtime.      . folic acid (FOLVITE) 1 MG tablet Take 1 mg by mouth daily.      . furosemide (LASIX) 40 MG tablet Take 40 mg by mouth daily as needed. For weight gain/swelling    . memantine (NAMENDA) 10 MG tablet     . metFORMIN (GLUCOPHAGE) 500 MG tablet Take 1,000 mg by mouth daily.     . metoprolol (LOPRESSOR) 50 MG tablet Take 25 mg by mouth 2 (two) times daily.     . Multiple Vitamin (MULTIVITAMIN) tablet Take 1 tablet by mouth daily.      . timolol (TIMOPTIC) 0.5 % ophthalmic solution Place 1 drop into both eyes daily.    . traMADol (ULTRAM) 50 MG tablet  Take 50 mg by mouth every 6 (six) hours as needed. For pain    . vitamin D, CHOLECALCIFEROL, 400 UNITS tablet Take 400 Units by mouth daily.      Marland Kitchen warfarin (COUMADIN) 5 MG tablet TAKE AS DIRECTED BY COUMADIN CLINIC 90 tablet 1  . valsartan (DIOVAN) 80 MG tablet Take 1 tablet (80 mg total) by mouth daily. 30 tablet 6   No current facility-administered medications for this visit.     Allergies: Allergies  Allergen Reactions  . Aricept [Donepezil Hcl] Diarrhea    Social History: The patient  reports that he quit smoking about 21 years ago. His smoking use included Cigarettes. He quit after 20.00 years of use. He has never used smokeless tobacco. He reports that he does not drink alcohol or use drugs.   Family History: The patient's family history includes Diabetes in his brother; Pulmonary embolism in his mother.   Review of Systems: Please see the history of present illness.   Otherwise, the review of systems is positive for none.   All other systems are reviewed and negative.   Physical Exam: VS:  BP 106/62   Pulse 76   Ht 6' (1.829 m)   Wt 171 lb 12.8 oz (77.9 kg)   BMI 23.30 kg/m  .  BMI Body mass index is 23.3 kg/m.  Wt Readings from Last 3 Encounters:  06/21/16 171 lb 12.8 oz (77.9 kg)  12/11/15 188 lb 9.6 oz (85.5 kg)  08/15/15 189 lb 1.9 oz (85.8 kg)    General: Pleasant. Elderly male who is alert and in no acute distress.   HEENT: Normal.  Neck: Supple, no JVD, carotid bruits, or masses noted.  Cardiac: Regular rate and rhythm - probably paced. No murmurs, rubs, or gallops. No edema.  Respiratory:  Lungs are clear to auscultation bilaterally with normal work of breathing.  GI: Soft and nontender.  MS: No deformity or atrophy. Gait and ROM intact.  Skin: Warm and dry. Color is normal.  Neuro:  Strength and sensation are intact and no gross focal deficits noted.  Psych: Alert, appropriate and with normal affect.   LABORATORY DATA:  EKG:  EKG is not ordered  today.  Lab Results  Component Value Date   WBC 9.7 11/28/2013   HGB 15.4 11/28/2013   HCT 44.8 11/28/2013   PLT 102 (L) 11/28/2013   GLUCOSE 90 08/15/2015   ALT 24 11/28/2013  AST 29 11/28/2013   NA 142 08/15/2015   K 4.2 08/15/2015   CL 102 08/15/2015   CREATININE 1.09 08/15/2015   BUN 25 08/15/2015   CO2 31 08/15/2015   TSH 0.81 11/16/2013   INR 2.0 06/21/2016    Lab Results  Component Value Date   INR 2.0 06/21/2016   INR 2.6 05/25/2016   INR 2.2 05/04/2016    BNP (last 3 results) No results for input(s): BNP in the last 8760 hours.  ProBNP (last 3 results) No results for input(s): PROBNP in the last 8760 hours.   Other Studies Reviewed Today:  Echo Study Conclusions from 10/2013  - Left ventricle: Septal apical and inferior wall hypokinesis. The cavity size was moderately dilated. Wall thickness was increased in a pattern of severe LVH. Systolic function was moderately to severely reduced. The estimated ejection fraction was in the range of 30% to 35%. - Aortic valve: There was very mild stenosis. - Mitral valve: Calcified annulus. There was mild regurgitation. - Left atrium: The atrium was moderately to severely dilated. - Right atrium: The atrium was mildly dilated. - Atrial septum: No defect or patent foramen ovale was identified.  Assessment/Plan:  1. Ischemic heart disease status post CABG originally in 1983 and then redo CABG in 1996. Ischemic cardiomyopathy with left ventricular dysfunction. No symptoms of heart failure.  2. Prior aortic valve replacement by Dr. Evelina Dun at [facility] for severe aortic stenosis  3. Memory loss - seems to be his most pressing issue at this time.   4. Permanent atrial fibrillation on Coumadin - for labs in about 2 weeks. Some falls noted - issue of continuing coumadin may need to be addressed if this persists.   5. Functioning pacemaker followed by Dr. Rayann Heman  6. HTN - BP is now low - I suspect this is  due to significant weight loss - changing Lasix to just prn. Cutting Diovan to 80 mg a day. He is seeing PCP in about 2 weeks - may need further adjustment of his Diovan at that time.   Current medicines are reviewed with the patient today.  The patient does not have concerns regarding medicines other than what has been noted above.  The following changes have been made:  See above.  Labs/ tests ordered today include:   No orders of the defined types were placed in this encounter.    Disposition:   FU with EP as planned in August. I will see back in 12 months.    Patient is agreeable to this plan and will call if any problems develop in the interim.   SignedTruitt Merle, NP  06/21/2016 3:31 PM  [facility] [address] [place], [place]  [address] Phone: [phone] Fax: [phone]

## 2016-07-01 DIAGNOSIS — E119 Type 2 diabetes mellitus without complications: Secondary | ICD-10-CM | POA: Diagnosis not present

## 2016-07-01 DIAGNOSIS — I10 Essential (primary) hypertension: Secondary | ICD-10-CM | POA: Diagnosis not present

## 2016-07-01 DIAGNOSIS — Z125 Encounter for screening for malignant neoplasm of prostate: Secondary | ICD-10-CM | POA: Diagnosis not present

## 2016-07-19 ENCOUNTER — Ambulatory Visit (INDEPENDENT_AMBULATORY_CARE_PROVIDER_SITE_OTHER): Payer: Medicare Other | Admitting: *Deleted

## 2016-07-19 DIAGNOSIS — I4891 Unspecified atrial fibrillation: Secondary | ICD-10-CM

## 2016-07-19 DIAGNOSIS — I255 Ischemic cardiomyopathy: Secondary | ICD-10-CM

## 2016-07-19 LAB — POCT INR: INR: 2.8

## 2016-07-22 DIAGNOSIS — I2510 Atherosclerotic heart disease of native coronary artery without angina pectoris: Secondary | ICD-10-CM | POA: Diagnosis not present

## 2016-07-22 DIAGNOSIS — I10 Essential (primary) hypertension: Secondary | ICD-10-CM | POA: Diagnosis not present

## 2016-07-22 DIAGNOSIS — I4891 Unspecified atrial fibrillation: Secondary | ICD-10-CM | POA: Diagnosis not present

## 2016-07-22 DIAGNOSIS — E119 Type 2 diabetes mellitus without complications: Secondary | ICD-10-CM | POA: Diagnosis not present

## 2016-07-28 ENCOUNTER — Encounter: Payer: Self-pay | Admitting: Family

## 2016-08-06 LAB — VAS US CAROTID
LCCAPDIAS: 9 cm/s
LCCAPSYS: 45 cm/s
LEFT ECA DIAS: -13 cm/s
LEFT VERTEBRAL DIAS: 11 cm/s
LICAPDIAS: -18 cm/s
Left CCA dist dias: 9 cm/s
Left CCA dist sys: 48 cm/s
Left ICA dist dias: -22 cm/s
Left ICA dist sys: -81 cm/s
Left ICA prox sys: -63 cm/s
RIGHT CCA MID DIAS: 9 cm/s
RIGHT ECA DIAS: -7 cm/s
RIGHT VERTEBRAL DIAS: -7 cm/s
Right CCA prox dias: -2 cm/s
Right CCA prox sys: -47 cm/s

## 2016-08-09 ENCOUNTER — Ambulatory Visit (INDEPENDENT_AMBULATORY_CARE_PROVIDER_SITE_OTHER): Payer: Medicare Other | Admitting: Family

## 2016-08-09 ENCOUNTER — Ambulatory Visit (HOSPITAL_COMMUNITY)
Admission: RE | Admit: 2016-08-09 | Discharge: 2016-08-09 | Disposition: A | Discharge status: Home / Self Care | Payer: Medicare Other | Source: Ambulatory Visit | Attending: Family | Admitting: Family

## 2016-08-09 ENCOUNTER — Encounter: Payer: Self-pay | Admitting: Family

## 2016-08-09 VITALS — BP 145/78 | HR 73 | Temp 96.8°F | Resp 16 | Ht 72.0 in | Wt 170.0 lb

## 2016-08-09 DIAGNOSIS — I6523 Occlusion and stenosis of bilateral carotid arteries: Secondary | ICD-10-CM | POA: Diagnosis not present

## 2016-08-09 DIAGNOSIS — I6522 Occlusion and stenosis of left carotid artery: Secondary | ICD-10-CM | POA: Insufficient documentation

## 2016-08-09 DIAGNOSIS — I255 Ischemic cardiomyopathy: Secondary | ICD-10-CM | POA: Diagnosis not present

## 2016-08-09 DIAGNOSIS — Z959 Presence of cardiac and vascular implant and graft, unspecified: Secondary | ICD-10-CM | POA: Diagnosis not present

## 2016-08-09 NOTE — Progress Notes (Signed)
Chief Complaint: Follow up Extracranial Carotid Artery Stenosis   History of Present Illness  Jeffrey Whitney is a 83 y.o. male patient of Dr. Trula Slade who was referred to him for reestablishing vascular care. The patient has a significant carotid artery history, all done at [facility]. He is status post carotid endarterectomy, and most recently carotid stenting in 2012. Per the patient, these were all for asymptomatic lesions. He remains asymptomatic.   The patient has a significant coronary history. He has undergone CABG in 1983 and 1996. He has also had a porcine aortic valve replaced at [facility]. A permanent pacemaker has been implanted for complete heart block. He is anticoagulated for atrial fibrillation.  The patient is a diabetic which is been complicated by neuropathy. His hypercholesterolemia is managed with a statin.  The patient denies any history of TIA or stroke symptoms, specifically the patient denies a history of amaurosis fugax or monocular blindness, denies a history unilateral  of facial drooping, denies a history of hemiplegia, and denies a history of receptive or expressive aphasia.    Wife voiced concern re his weight loss; lost 21 pounds in the last year. Wife states he does not eat nearly as much as he used to, pt states his appetite is fine, he denies post prandial abdominal pan.  He denies claudication symptoms with walking. Wife states he is unsteady on his feet, and walks only for ADL's. Wife states he has fallen a couple of times recently with no injury; wife states that this has been discussed with his PCP.  Wife states he had some basic dementia testing in his PCP's office and states the results were not normal.   Pt Diabetic: yes, does not know his A1C, not on file, but seems in good control with his weight loss Pt smoker: former smoker, quit about 1995  Pt meds include: Statin : yes ASA: yes Other anticoagulants/antiplatelets: warfarin,   is s/p aortic valve replacement, has a hx of atrial fib   Past Medical History:  Diagnosis Date  . A-fib (HCC)    Permanent afib  . CAD (coronary artery disease)    s/p CABG  . Complete heart block ([place]) 05/24/2005   Pacemaker (MDT) implanted by Dr Verlon Setting  . DM (diabetes mellitus) ([place])   . HTN (hypertension)   . Hyperlipidemia   . 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)   . PVD (peripheral vascular disease) ([place])    s/p remote bilateral femoral bypass at [facility]  . S/P AVR (aortic valve replacement)    mechanical valve  . Venous insufficiency     Social History Social History  Substance Use Topics  . Smoking status: Former Smoker    Years: 20.00    Types: Cigarettes    Quit date: 07/15/1994  . Smokeless tobacco: Never Used  . Alcohol use No    Family History Family History  Problem Relation Age of Onset  . Pulmonary embolism Mother   . Diabetes Brother   . Coronary artery disease    . Heart attack Neg Hx     Surgical History Past Surgical History:  Procedure Laterality Date  . CAROTID ENDARTERECTOMY  06/25/10   at [place]     at [facility]  . FEMORAL ARTERY - FEMORAL ARTERY BYPASS GRAFT     remote  . PACEMAKER GENERATOR CHANGE  05/18/12   MDT Sherril Croon SR implanted by Dr Rayann Heman  . PACEMAKER GENERATOR CHANGE N/A  05/18/2012   Procedure: PACEMAKER GENERATOR CHANGE;  Surgeon: [place] Grayer, MD;  Location: [facility] CATH LAB;  Service: Cardiovascular;  Laterality: N/A;  . PACEMAKER INSERTION  05/24/2005   MDT by Dr Verlon Setting  . R carotid stent placement  1/13   at [place] Reactions  . Aricept [Donepezil Hcl] Diarrhea    Current Outpatient Prescriptions  Medication Sig Dispense Refill  . acetaminophen (TYLENOL) 500 MG tablet Take 500 mg by mouth every 6 (six) hours as needed for moderate pain.    Marland Kitchen aspirin 81 MG tablet Take 81 mg by mouth daily.     Marland Kitchen atorvastatin (LIPITOR) 40 MG tablet Take 40 mg by  mouth at bedtime.      . bimatoprost (LUMIGAN) 0.03 % ophthalmic drops Place 1 drop into both eyes at bedtime.      . folic acid (FOLVITE) 1 MG tablet Take 1 mg by mouth daily.      . furosemide (LASIX) 40 MG tablet Take 40 mg by mouth daily as needed. For weight gain/swelling    . memantine (NAMENDA) 10 MG tablet 2 (two) times daily.     . metFORMIN (GLUCOPHAGE) 500 MG tablet Take 1,000 mg by mouth daily.     . metoprolol (LOPRESSOR) 50 MG tablet Take 25 mg by mouth 2 (two) times daily.     . Multiple Vitamin (MULTIVITAMIN) tablet Take 1 tablet by mouth daily.      . timolol (TIMOPTIC) 0.5 % ophthalmic solution Place 1 drop into both eyes daily.    . traMADol (ULTRAM) 50 MG tablet Take 50 mg by mouth every 6 (six) hours as needed. For pain    . valsartan (DIOVAN) 80 MG tablet Take 1 tablet (80 mg total) by mouth daily. 30 tablet 6  . vitamin D, CHOLECALCIFEROL, 400 UNITS tablet Take 400 Units by mouth daily.      Marland Kitchen warfarin (COUMADIN) 5 MG tablet TAKE AS DIRECTED BY COUMADIN CLINIC 90 tablet 1   No current facility-administered medications for this visit.     Review of Systems : See HPI for pertinent positives and negatives.  Physical Examination  Vitals:   08/09/16 1133 08/09/16 1136 08/09/16 1139  BP: (!) 148/89 (!) 148/84 (!) 145/78  Pulse: 70 73   Resp: 16    Temp: (!) 96.8 F (36 C)    TempSrc: Oral    SpO2: 100%    Weight: 170 lb (77.1 kg)    Height: 6' (1.829 m)     Body mass index is 23.06 kg/m.  General:WDWN male in NAD GAIT: normal Eyes: PERRLA Pulmonary: Respirations are non-labored, CTAB, no rales, rhonchi, or wheezing.  Cardiac: Irregular rhythm, controlled rate, no detected murmur. Pacemaker palpated subcutaneous left upper chest.  VASCULAR EXAM Carotid Bruits Right Left   Negative Negative    Aorta is not palpable. Radial pulses are 2+ palpable and equal.  LE Pulses Right Left       POPLITEAL   palpable    palpable       POSTERIOR TIBIAL   palpable    palpable        DORSALIS PEDIS      ANTERIOR TIBIAL not palpable  not palpable     Gastrointestinal: soft, nontender, BS WNL, no r/g,  no palpable masses.  Musculoskeletal: no muscle atrophy/wasting. M/S 5/5 throughout, extremities without ischemic changes.  Neurologic: A&O X 2; Appropriate Affect, Speech is normal CN 2-12 intact except is hard of hearing, pain and light touch intact in extremities, Motor exam as listed above.     Assessment: Jeffrey Whitney is a 83 y.o. male who is s/p right ICA stenting in 2012 at [facility]. He has no history of stroke or TIA. He reports DM peripheral neuropathy in his feet, symptoms seem mild or moderate.   His atherosclerotic risk factors include DM, former smoker, and fairly sedentary lifestyle.   He has been unsteady on his feet since I saw him a year ago, wife states he has fallen twice recently with no injury. He may benefit from a course of physical therapy for gait strengthening, will defer to pt's PCP.     DATA Today's carotid duplex suggests right internal carotid artery stent is without evidence of restenosis. Less than 40% left internal carotid artery stenosis. Bilateral vertebral artery flow is antegrade.  Bilateral subclavian artery waveforms are normal.  No change since exam of 07-21-15.     Plan:  Follow-up in 1 year with Carotid Duplex scan.   I discussed in depth with the patient the nature of atherosclerosis, and emphasized the importance of maximal medical management including strict control of blood pressure, blood glucose, and lipid levels, obtaining regular exercise, and continued cessation of smoking.  The patient is aware that without maximal medical management the underlying atherosclerotic disease process will progress, limiting the benefit of any  interventions. The patient was given information about stroke prevention and what symptoms should prompt the patient to seek immediate medical care. Thank you for allowing [place] to participate in this patient's care.  Clemon Chambers, RN, MSN, FNP-C [facility] Office: [facility] Physician: Trula Slade  08/09/16 11:59 AM

## 2016-08-09 NOTE — Patient Instructions (Signed)
Stroke Prevention Some medical conditions and behaviors are associated with an increased chance of having a stroke. You may prevent a stroke by making healthy choices and managing medical conditions. How can I reduce my risk of having a stroke?  Stay physically active. Get at least 30 minutes of activity on most or all days.  Do not smoke. It may also be helpful to avoid exposure to secondhand smoke.  Limit alcohol use. Moderate alcohol use is considered to be:  No more than 2 drinks per day for men.  No more than 1 drink per day for nonpregnant women.  Eat healthy foods. This involves:  Eating 5 or more servings of fruits and vegetables a day.  Making dietary changes that address high blood pressure (hypertension), high cholesterol, diabetes, or obesity.  Manage your cholesterol levels.  Making food choices that are high in fiber and low in saturated fat, trans fat, and cholesterol may control cholesterol levels.  Take any prescribed medicines to control cholesterol as directed by your health care provider.  Manage your diabetes.  Controlling your carbohydrate and sugar intake is recommended to manage diabetes.  Take any prescribed medicines to control diabetes as directed by your health care provider.  Control your hypertension.  Making food choices that are low in salt (sodium), saturated fat, trans fat, and cholesterol is recommended to manage hypertension.  Ask your health care provider if you need treatment to lower your blood pressure. Take any prescribed medicines to control hypertension as directed by your health care provider.  If you are 18-39 years of age, have your blood pressure checked every 3-5 years. If you are 40 years of age or older, have your blood pressure checked every year.  Maintain a healthy weight.  Reducing calorie intake and making food choices that are low in sodium, saturated fat, trans fat, and cholesterol are recommended to manage  weight.  Stop drug abuse.  Avoid taking birth control pills.  Talk to your health care provider about the risks of taking birth control pills if you are over 35 years old, smoke, get migraines, or have ever had a blood clot.  Get evaluated for sleep disorders (sleep apnea).  Talk to your health care provider about getting a sleep evaluation if you snore a lot or have excessive sleepiness.  Take medicines only as directed by your health care provider.  For some people, aspirin or blood thinners (anticoagulants) are helpful in reducing the risk of forming abnormal blood clots that can lead to stroke. If you have the irregular heart rhythm of atrial fibrillation, you should be on a blood thinner unless there is a good reason you cannot take them.  Understand all your medicine instructions.  Make sure that other conditions (such as anemia or atherosclerosis) are addressed. Get help right away if:  You have sudden weakness or numbness of the face, arm, or leg, especially on one side of the body.  Your face or eyelid droops to one side.  You have sudden confusion.  You have trouble speaking (aphasia) or understanding.  You have sudden trouble seeing in one or both eyes.  You have sudden trouble walking.  You have dizziness.  You have a loss of balance or coordination.  You have a sudden, severe headache with no known cause.  You have new chest pain or an irregular heartbeat. Any of these symptoms may represent a serious problem that is an emergency. Do not wait to see if the symptoms will go away.   Get medical help at once. Call your local emergency services (911 in [place] Do not drive yourself to the hospital. This information is not intended to replace advice given to you by your health care provider. Make sure you discuss any questions you have with your health care provider. Document Released: 05/27/2004 Document Revised: 09/25/2015 Document Reviewed: 10/20/2012 Elsevier  Interactive Patient Education  2017 Elsevier Inc.      Preventing Cerebrovascular Disease Arteries are blood vessels that carry blood that contains oxygen from the heart to all parts of the body. Cerebrovascular disease affects arteries that supply the brain. Any condition that blocks or disrupts blood flow to the brain can cause cerebrovascular disease. Brain cells that lose blood supply start to die within minutes (stroke). Stroke is the main danger of cerebrovascular disease. Atherosclerosis and high blood pressure are common causes of cerebrovascular disease. Atherosclerosis is narrowing and hardening of an artery that results when fat, cholesterol, calcium, or other substances (plaque) build up inside an artery. Plaque reduces blood flow through the artery. High blood pressure increases the risk of bleeding inside the brain. Making diet and lifestyle changes to prevent atherosclerosis and high blood pressure lowers your risk of cerebrovascular disease. What nutrition changes can be made?  Eat more fruits, vegetables, and whole grains.  Reduce how much saturated fat you eat. To do this, eat less red meat and fewer full-fat dairy products.  Eat healthy proteins instead of red meat. Healthy proteins include:  Fish. Eat fish that contains heart-healthy omega-3 fatty acids, twice a week. Examples include salmon, albacore tuna, mackerel, and herring.  Chicken.  Nuts.  Low-fat or nonfat yogurt.  Avoid processed meats, like bacon and lunchmeat.  Avoid foods that contain:  A lot of sugar, such as sweets and drinks with added sugar.  A lot of salt (sodium). Avoid adding extra salt to your food, as told by your health care provider.  Trans fats, such as margarine and baked goods. Trans fats may be listed as "partially hydrogenated oils" on food labels.  Check food labels to see how much sodium, sugar, and trans fats are in foods.  Use vegetable oils that contain low amounts of  saturated fat, such as olive oil or canola oil. What lifestyle changes can be made?  Drink alcohol in moderation. This means no more than 1 drink a day for nonpregnant women and 2 drinks a day for men. One drink equals 12 oz of beer, 5 oz of wine, or 1 oz of hard liquor.  If you are overweight, ask your health care provider to recommend a weight-loss plan for you. Losing 5-10 lb (2.2-4.5 kg) can reduce your risk of diabetes, atherosclerosis, and high blood pressure.  Exercise for 30?60 minutes on most days, or as much as told by your health care provider.  Do moderate-intensity exercise, such as brisk walking, bicycling, and water aerobics. Ask your health care provider which activities are safe for you.  Do not use any products that contain nicotine or tobacco, such as cigarettes and e-cigarettes. If you need help quitting, ask your health care provider. Why are these changes important? Making these changes lowers your risk of many diseases that can cause cerebrovascular disease and stroke. Stroke is a leading cause of death and disability. Making these changes also improves your overall health and quality of life. What can I do to lower my risk? The following factors make you more likely to develop cerebrovascular disease:  Being overweight.  Smoking.  Being physically   inactive.  Eating a high-fat diet.  Having certain health conditions, such as:  Diabetes.  High blood pressure.  Heart disease.  Atherosclerosis.  High cholesterol.  Sickle cell disease. Talk with your health care provider about your risk for cerebrovascular disease. Work with your health care provider to control diseases that you have that may contribute to cerebrovascular disease. Your health care provider may prescribe medicines to help prevent major causes of cerebrovascular disease. Where to find more information: Learn more about preventing cerebrovascular disease from:  National Heart, Lung, and  Blood Institute: www.nhlbi.nih.gov/health/health-topics/topics/stroke  Centers for Disease Control and Prevention: cdc.gov/stroke/about.htm Summary  Cerebrovascular disease can lead to a stroke.  Atherosclerosis and high blood pressure are major causes of cerebrovascular disease.  Making diet and lifestyle changes can reduce your risk of cerebrovascular disease.  Work with your health care provider to get your risk factors under control to reduce your risk of cerebrovascular disease. This information is not intended to replace advice given to you by your health care provider. Make sure you discuss any questions you have with your health care provider. Document Released: 05/04/2015 Document Revised: 11/07/2015 Document Reviewed: 05/04/2015 Elsevier Interactive Patient Education  2017 Elsevier Inc.  

## 2016-08-10 NOTE — Addendum Note (Signed)
Addended by: Lianne Cure A on: 08/10/2016 10:09 AM   Modules accepted: Orders

## 2016-08-24 ENCOUNTER — Emergency Department (HOSPITAL_COMMUNITY)
Admission: EM | Admit: 2016-08-24 | Discharge: 2016-08-24 | Disposition: A | Discharge status: Home / Self Care | Payer: Medicare Other | Attending: Emergency Medicine | Admitting: Emergency Medicine

## 2016-08-24 ENCOUNTER — Emergency Department (HOSPITAL_COMMUNITY): Payer: Medicare Other

## 2016-08-24 ENCOUNTER — Encounter (HOSPITAL_COMMUNITY): Payer: Self-pay | Admitting: Emergency Medicine

## 2016-08-24 DIAGNOSIS — E860 Dehydration: Secondary | ICD-10-CM | POA: Diagnosis not present

## 2016-08-24 DIAGNOSIS — Z7982 Long term (current) use of aspirin: Secondary | ICD-10-CM | POA: Diagnosis not present

## 2016-08-24 DIAGNOSIS — R531 Weakness: Secondary | ICD-10-CM | POA: Diagnosis present

## 2016-08-24 DIAGNOSIS — K4090 Unilateral inguinal hernia, without obstruction or gangrene, not specified as recurrent: Secondary | ICD-10-CM | POA: Diagnosis not present

## 2016-08-24 DIAGNOSIS — Z87891 Personal history of nicotine dependence: Secondary | ICD-10-CM | POA: Insufficient documentation

## 2016-08-24 DIAGNOSIS — Z79899 Other long term (current) drug therapy: Secondary | ICD-10-CM | POA: Insufficient documentation

## 2016-08-24 DIAGNOSIS — I2510 Atherosclerotic heart disease of native coronary artery without angina pectoris: Secondary | ICD-10-CM | POA: Diagnosis not present

## 2016-08-24 DIAGNOSIS — Z950 Presence of cardiac pacemaker: Secondary | ICD-10-CM | POA: Diagnosis not present

## 2016-08-24 DIAGNOSIS — I959 Hypotension, unspecified: Secondary | ICD-10-CM | POA: Diagnosis not present

## 2016-08-24 DIAGNOSIS — R300 Dysuria: Secondary | ICD-10-CM | POA: Diagnosis not present

## 2016-08-24 DIAGNOSIS — R918 Other nonspecific abnormal finding of lung field: Secondary | ICD-10-CM | POA: Diagnosis not present

## 2016-08-24 DIAGNOSIS — R339 Retention of urine, unspecified: Secondary | ICD-10-CM | POA: Diagnosis not present

## 2016-08-24 DIAGNOSIS — Z951 Presence of aortocoronary bypass graft: Secondary | ICD-10-CM | POA: Insufficient documentation

## 2016-08-24 DIAGNOSIS — R031 Nonspecific low blood-pressure reading: Secondary | ICD-10-CM | POA: Diagnosis not present

## 2016-08-24 DIAGNOSIS — Z7901 Long term (current) use of anticoagulants: Secondary | ICD-10-CM | POA: Diagnosis not present

## 2016-08-24 LAB — BASIC METABOLIC PANEL
Anion gap: 10 (ref 5–15)
BUN: 43 mg/dL — AB (ref 6–20)
CO2: 28 mmol/L (ref 22–32)
CREATININE: 1.51 mg/dL — AB (ref 0.61–1.24)
Calcium: 8.9 mg/dL (ref 8.9–10.3)
Chloride: 99 mmol/L — ABNORMAL LOW (ref 101–111)
GFR calc Af Amer: 47 mL/min — ABNORMAL LOW (ref >60)
GFR, EST NON AFRICAN AMERICAN: 41 mL/min — AB (ref >60)
Glucose, Bld: 179 mg/dL — ABNORMAL HIGH (ref 65–99)
POTASSIUM: 4.7 mmol/L (ref 3.5–5.1)
Sodium: 137 mmol/L (ref 135–145)

## 2016-08-24 LAB — CBC WITH DIFFERENTIAL/PLATELET
Basophils Absolute: 0 10*3/uL (ref 0.0–0.1)
Basophils Relative: 0 %
EOS ABS: 0 10*3/uL (ref 0.0–0.7)
EOS PCT: 0 %
HCT: 33.4 % — ABNORMAL LOW (ref 39.0–52.0)
Hemoglobin: 11.3 g/dL — ABNORMAL LOW (ref 13.0–17.0)
Lymphocytes Relative: 3 %
Lymphs Abs: 0.5 10*3/uL — ABNORMAL LOW (ref 0.7–4.0)
MCH: 31.9 pg (ref 26.0–34.0)
MCHC: 33.8 g/dL (ref 30.0–36.0)
MCV: 94.4 fL (ref 78.0–100.0)
Monocytes Absolute: 1.4 10*3/uL — ABNORMAL HIGH (ref 0.1–1.0)
Monocytes Relative: 9 %
NEUTROS PCT: 88 %
Neutro Abs: 14 10*3/uL — ABNORMAL HIGH (ref 1.7–7.7)
PLATELETS: 174 10*3/uL (ref 150–400)
RBC: 3.54 MIL/uL — AB (ref 4.22–5.81)
RDW: 13.7 % (ref 11.5–15.5)
WBC: 15.9 10*3/uL — AB (ref 4.0–10.5)

## 2016-08-24 LAB — I-STAT CHEM 8, ED
BUN: 39 mg/dL — ABNORMAL HIGH (ref 6–20)
CALCIUM ION: 1.15 mmol/L (ref 1.15–1.40)
Chloride: 99 mmol/L — ABNORMAL LOW (ref 101–111)
Creatinine, Ser: 1.5 mg/dL — ABNORMAL HIGH (ref 0.61–1.24)
Glucose, Bld: 177 mg/dL — ABNORMAL HIGH (ref 65–99)
HCT: 33 % — ABNORMAL LOW (ref 39.0–52.0)
Hemoglobin: 11.2 g/dL — ABNORMAL LOW (ref 13.0–17.0)
Potassium: 4.7 mmol/L (ref 3.5–5.1)
SODIUM: 138 mmol/L (ref 135–145)
TCO2: 28 mmol/L (ref 0–100)

## 2016-08-24 LAB — URINALYSIS, ROUTINE W REFLEX MICROSCOPIC
BILIRUBIN URINE: NEGATIVE
Glucose, UA: NEGATIVE mg/dL
HGB URINE DIPSTICK: NEGATIVE
Ketones, ur: NEGATIVE mg/dL
Leukocytes, UA: NEGATIVE
Nitrite: NEGATIVE
PROTEIN: 30 mg/dL — AB
Specific Gravity, Urine: 1.027 (ref 1.005–1.030)
Squamous Epithelial / LPF: NONE SEEN
pH: 5 (ref 5.0–8.0)

## 2016-08-24 LAB — I-STAT TROPONIN, ED: TROPONIN I, POC: 0.01 ng/mL (ref 0.00–0.08)

## 2016-08-24 MED ORDER — SODIUM CHLORIDE 0.9 % IV BOLUS (SEPSIS)
1000.0000 mL | Freq: Once | INTRAVENOUS | Status: AC
  Administered 2016-08-24: 1000 mL via INTRAVENOUS

## 2016-08-24 NOTE — ED Notes (Signed)
Bed: RESB Expected date:  Expected time:  Means of arrival:  Comments: EMS-low BP-urinary retention

## 2016-08-24 NOTE — Discharge Instructions (Signed)
Eat and drink well for the next week.  Follow up with your fam doc for repeat blood draw to test your kidney function

## 2016-08-24 NOTE — ED Notes (Signed)
Patient aware we need urine specimen. Will void when able.

## 2016-08-24 NOTE — ED Notes (Signed)
Patient still unable to void. Urinal still at bedside.

## 2016-08-24 NOTE — ED Triage Notes (Signed)
Per [facility] patient comes from [facility] for urinary retention and hypotension.   Office staff reported to EMS that BP 60/40 and oral temperature of 92. Patient wife reported that patient hasnt eaten much in past couple days and not having as much urine output as normal.   EMS started 24g in left [facility] and gave about NS 62ml in route.  BP 120/58 with EMS.

## 2016-08-24 NOTE — ED Provider Notes (Addendum)
[facility] DEPT Provider Note   CSN: [id number] Arrival date & time: 08/24/16  1724     History   Chief Complaint Chief Complaint  Patient presents with  . Urinary Retention    HPI Jeffrey Whitney is a 83 y.o. male.  83 yo M with a chief complaint of weakness.  Going on for the past couple days. Per the family patient has not really been eating and drinking. Went to the family 67 office today found have a blood pressure 60/40 and was sent to the ED. Patient states that he has had no issues today. That he feels fine currently. Denies chest pain shortness breath cough congestion fevers vomiting or diarrhea. Patient also denies the fact that he has not been eating and drinking for the past 48 hours. Said he had breakfast this morning without difficulty. Just this afternoon didn't feel like eating.   The history is provided by the patient and medical records.  Illness  This is a new problem. The current episode started 2 days ago. The problem occurs constantly. The problem has not changed since onset.Pertinent negatives include no chest pain, no abdominal pain, no headaches and no shortness of breath. Nothing aggravates the symptoms. Nothing relieves the symptoms. He has tried nothing for the symptoms. The treatment provided no relief.    Past Medical History:  Diagnosis Date  . A-fib (HCC)    Permanent afib  . CAD (coronary artery disease)    s/p CABG  . Complete heart block ([place]) 05/24/2005   Pacemaker (MDT) implanted by Dr Verlon Setting  . DM (diabetes mellitus) ([place])   . HTN (hypertension)   . Hyperlipidemia   . Peripheral autonomic neuropathy due to DM ([place])   . PVD (peripheral vascular disease) ([place])    s/p remote bilateral femoral bypass at [facility] at [facility]  . S/P AVR (aortic valve replacement)    mechanical valve  . Venous insufficiency     Patient Active Problem List   Diagnosis Date Noted  . Carotid stenosis 07/15/2014  . Weight loss, unintentional  11/16/2013  . Left ventricular dysfunction with reduced left ventricular function 11/16/2013  . Atrioventricular block, complete ([place]) 07/30/2010  . Essential hypertension, benign 07/30/2010  . Coronary atherosclerosis 07/30/2010  . S/P AVR (aortic valve replacement) 07/30/2010  . PVD (peripheral vascular disease) ([place]) 07/30/2010  . Atrial fibrillation ([place]) 07/27/2010    Past Surgical History:  Procedure Laterality Date  . CAROTID ENDARTERECTOMY  06/25/10   at [place]     at [facility]  . FEMORAL ARTERY - FEMORAL ARTERY BYPASS GRAFT     remote  . PACEMAKER GENERATOR CHANGE  05/18/12   MDT Sherril Croon SR implanted by Dr Rayann Heman  . PACEMAKER GENERATOR CHANGE N/A 05/18/2012   Procedure: PACEMAKER GENERATOR CHANGE;  Surgeon: Thompson Grayer, MD;  [facility] CATH LAB;  Service: Cardiovascular;  Laterality: N/A;  . PACEMAKER INSERTION  05/24/2005   MDT by Dr Verlon Setting  . R carotid stent placement  1/13   at [facility] Medications    Prior to Admission medications   Medication Sig Start Date End Date Taking? Authorizing Provider  acetaminophen (TYLENOL) 500 MG tablet Take 500 mg by mouth every 6 (six) hours as needed for moderate pain.   Yes Historical Provider, MD  aspirin 81 MG tablet Take 81 mg by mouth daily.    Yes Historical Provider, MD  atorvastatin (LIPITOR) 40 MG tablet Take 40  mg by mouth at bedtime.     Yes Historical Provider, MD  bimatoprost (LUMIGAN) 0.03 % ophthalmic drops Place 1 drop into both eyes at bedtime.     Yes Historical Provider, MD  folic acid (FOLVITE) 1 MG tablet Take 1 mg by mouth daily.     Yes Historical Provider, MD  furosemide (LASIX) 40 MG tablet Take 40 mg by mouth daily. For weight gain/swelling   Yes Historical Provider, MD  memantine (NAMENDA) 10 MG tablet Take 10 mg by mouth 2 (two) times daily.  12/08/15  Yes Historical Provider, MD  metFORMIN (GLUCOPHAGE) 500 MG tablet Take 500 mg by mouth daily.    Yes Historical  Provider, MD  metoprolol succinate (TOPROL-XL) 50 MG 24 hr tablet Take 50 mg by mouth daily. Take with or immediately following a meal.   Yes Historical Provider, MD  Multiple Vitamin (MULTIVITAMIN) tablet Take 1 tablet by mouth daily.     Yes Historical Provider, MD  timolol (TIMOPTIC) 0.5 % ophthalmic solution Place 1 drop into both eyes daily. 11/24/13  Yes Historical Provider, MD  traMADol (ULTRAM) 50 MG tablet Take 50 mg by mouth every 6 (six) hours as needed. For pain   Yes Historical Provider, MD  vitamin D, CHOLECALCIFEROL, 400 UNITS tablet Take 400 Units by mouth daily.     Yes Historical Provider, MD  warfarin (COUMADIN) 5 MG tablet TAKE AS DIRECTED BY COUMADIN CLINIC Patient taking differently: Take 2.5-5 mg by mouth as directed. Take 1/2 tablet (2.5 mg) on Sun, Wed and Fri and Take 1 tablet (5 mg) on Mon, Tues, Thurs, Sat 06/21/16  Yes Belva Crome, MD  valsartan (DIOVAN) 80 MG tablet Take 1 tablet (80 mg total) by mouth daily. Patient not taking: Reported on 08/24/2016 06/21/16   Burtis Junes, NP    Family History Family History  Problem Relation Age of Onset  . Pulmonary embolism Mother   . Diabetes Brother   . Coronary artery disease    . Heart attack Neg Hx     Social History Social History  Substance Use Topics  . Smoking status: Former Smoker    Years: 20.00    Types: Cigarettes    Quit date: 07/15/1994  . Smokeless tobacco: Never Used  . Alcohol use No     Allergies   Aricept [donepezil hcl]   Review of Systems Review of Systems  Constitutional: Negative for chills and fever.  HENT: Negative for congestion and facial swelling.   Eyes: Negative for discharge and visual disturbance.  Respiratory: Negative for shortness of breath.   Cardiovascular: Negative for chest pain and palpitations.  Gastrointestinal: Negative for abdominal pain, diarrhea and vomiting.  Genitourinary: Positive for decreased urine volume.  Musculoskeletal: Negative for arthralgias  and myalgias.  Skin: Negative for color change and rash.  Neurological: Negative for tremors, syncope and headaches.  Psychiatric/Behavioral: Negative for confusion and dysphoric mood.     Physical Exam Updated Vital Signs BP 133/84 (BP Location: Right Arm)   Pulse 76   Temp 98.3 F (36.8 C) (Oral)   Resp 20   Ht 6' (1.829 m)   SpO2 98%   Physical Exam  Constitutional: He is oriented to person, place, and time. He appears well-developed and well-nourished.  HENT:  Head: Normocephalic and atraumatic.  Eyes: EOM are normal. Pupils are equal, round, and reactive to light.  Neck: Normal range of motion. Neck supple. No JVD present.  Cardiovascular: Normal rate and regular rhythm.  Exam reveals  no gallop and no friction rub.   No murmur heard. Pulmonary/Chest: No respiratory distress. He has no wheezes.  Abdominal: He exhibits no distension and no mass. There is no tenderness. There is no rebound and no guarding. A hernia (palpable defect about the right inguinal region) is present.  Musculoskeletal: Normal range of motion.  Neurological: He is alert and oriented to person, place, and time.  Skin: No rash noted. No pallor.  Psychiatric: He has a normal mood and affect. His behavior is normal.  Nursing note and vitals reviewed.    ED Treatments / Results  Labs (all labs ordered are listed, but only abnormal results are displayed) Labs Reviewed  CBC WITH DIFFERENTIAL/PLATELET - Abnormal; Notable for the following:       Result Value   WBC 15.9 (*)    RBC 3.54 (*)    Hemoglobin 11.3 (*)    HCT 33.4 (*)    Neutro Abs 14.0 (*)    Lymphs Abs 0.5 (*)    Monocytes Absolute 1.4 (*)    All other components within normal limits  BASIC METABOLIC PANEL - Abnormal; Notable for the following:    Chloride 99 (*)    Glucose, Bld 179 (*)    BUN 43 (*)    Creatinine, Ser 1.51 (*)    GFR calc non Af Amer 41 (*)    GFR calc Af Amer 47 (*)    All other components within normal limits    URINALYSIS, ROUTINE W REFLEX MICROSCOPIC - Abnormal; Notable for the following:    Color, Urine AMBER (*)    APPearance HAZY (*)    Protein, ur 30 (*)    Bacteria, UA RARE (*)    All other components within normal limits  I-STAT CHEM 8, ED - Abnormal; Notable for the following:    Chloride 99 (*)    BUN 39 (*)    Creatinine, Ser 1.50 (*)    Glucose, Bld 177 (*)    Hemoglobin 11.2 (*)    HCT 33.0 (*)    All other components within normal limits  I-STAT TROPOININ, ED    EKG  EKG Interpretation  Date/Time:  Tuesday August 24 2016 18:03:31 EDT Ventricular Rate:  73 PR Interval:    QRS Duration: 175 QT Interval:  463 QTC Calculation: 511 R Axis:   1 Text Interpretation:  Sinus rhythm Left bundle branch block No significant change since last tracing Confirmed by Vidur Knust MD, DANIEL [phone]) on 08/24/2016 6:38:26 PM       Radiology Dg Chest 2 View  Result Date: 08/24/2016 CLINICAL DATA:  Acute onset of altered mental status and generalized weakness. Initial encounter. EXAM: CHEST  2 VIEW COMPARISON:  Chest radiograph performed 09/11/2014 FINDINGS: The lungs are mildly hypoexpanded but appear grossly clear. There is no evidence of focal opacification, pleural effusion or pneumothorax. The heart is normal in size; the patient is status post median sternotomy, with evidence of prior CABG. A pacemaker is noted at the left chest wall, with leads ending at the right atrium and right ventricle. No acute osseous abnormalities are seen. IMPRESSION: Lungs mildly hypoexpanded but grossly clear. Electronically Signed   By: Garald Balding M.D.   On: 08/24/2016 18:37    Procedures Procedures (including critical care time)  Medications Ordered in ED Medications  sodium chloride 0.9 % bolus 1,000 mL (0 mLs Intravenous Stopped 08/24/16 2012)  sodium chloride 0.9 % bolus 1,000 mL (1,000 mLs Intravenous New Bag/Given 08/24/16 2046)  Initial Impression / Assessment and Plan / ED Course  I have  reviewed the triage vital signs and the nursing notes.  Pertinent labs & imaging results that were available during my care of the patient were reviewed by me and considered in my medical decision making (see chart for details).     83 yo M With a chief complaint of weakness. Patient was found to be hypotensive in the physician's office. Per EMS he had a normal blood pressure in here he was 130/80. With this transient hypotension and will check an EKG labs reassess.  Patient is feeling much better on reassessment. Asking me discharged home. Chest x-ray and UA without infection. Patient does have a mild AKI. His hemoglobin is lower than his baseline though that was from 4 years ago. We'll have him follow-up with his family physician.  10:48 PM:  I have discussed the diagnosis/risks/treatment options with the patient and family and believe the pt to be eligible for discharge home to follow-up with PCP. We also discussed returning to the ED immediately if new or worsening sx occur. We discussed the sx which are most concerning (e.g., sudden worsening pain, fever, inability to tolerate by mouth) that necessitate immediate return. Medications administered to the patient during their visit and any new prescriptions provided to the patient are listed below.  Medications given during this visit Medications  sodium chloride 0.9 % bolus 1,000 mL (0 mLs Intravenous Stopped 08/24/16 2012)  sodium chloride 0.9 % bolus 1,000 mL (1,000 mLs Intravenous New Bag/Given 08/24/16 2046)     The patient appears reasonably screen and/or stabilized for discharge and I doubt any other medical condition or other [facility] requiring further screening, evaluation, or treatment in the ED at this time prior to discharge.    Final Clinical Impressions(s) / ED Diagnoses   Final diagnoses:  Direct inguinal hernia  Transient hypotension    New Prescriptions New Prescriptions   No medications on file     Deno Etienne,  DO 08/24/16 [place], DO 08/24/16 2249

## 2016-08-31 DIAGNOSIS — 419620001 Death: Secondary | SNOMED CT | POA: Diagnosis not present

## 2016-08-31 DEATH — deceased

## 2017-08-15 ENCOUNTER — Encounter (HOSPITAL_COMMUNITY): Payer: Medicare Other

## 2017-08-15 ENCOUNTER — Ambulatory Visit: Payer: Medicare Other | Admitting: Family

## 2017-12-17 IMAGING — CR DG CHEST 2V
2 series · 2 of 2 positions shown · non-contrast
Comparison: Chest radiograph performed 09/11/2014

CLINICAL DATA: Acute onset of altered mental status and generalized
weakness. Initial encounter.

EXAM:
CHEST  2 VIEW

[w chest lat]
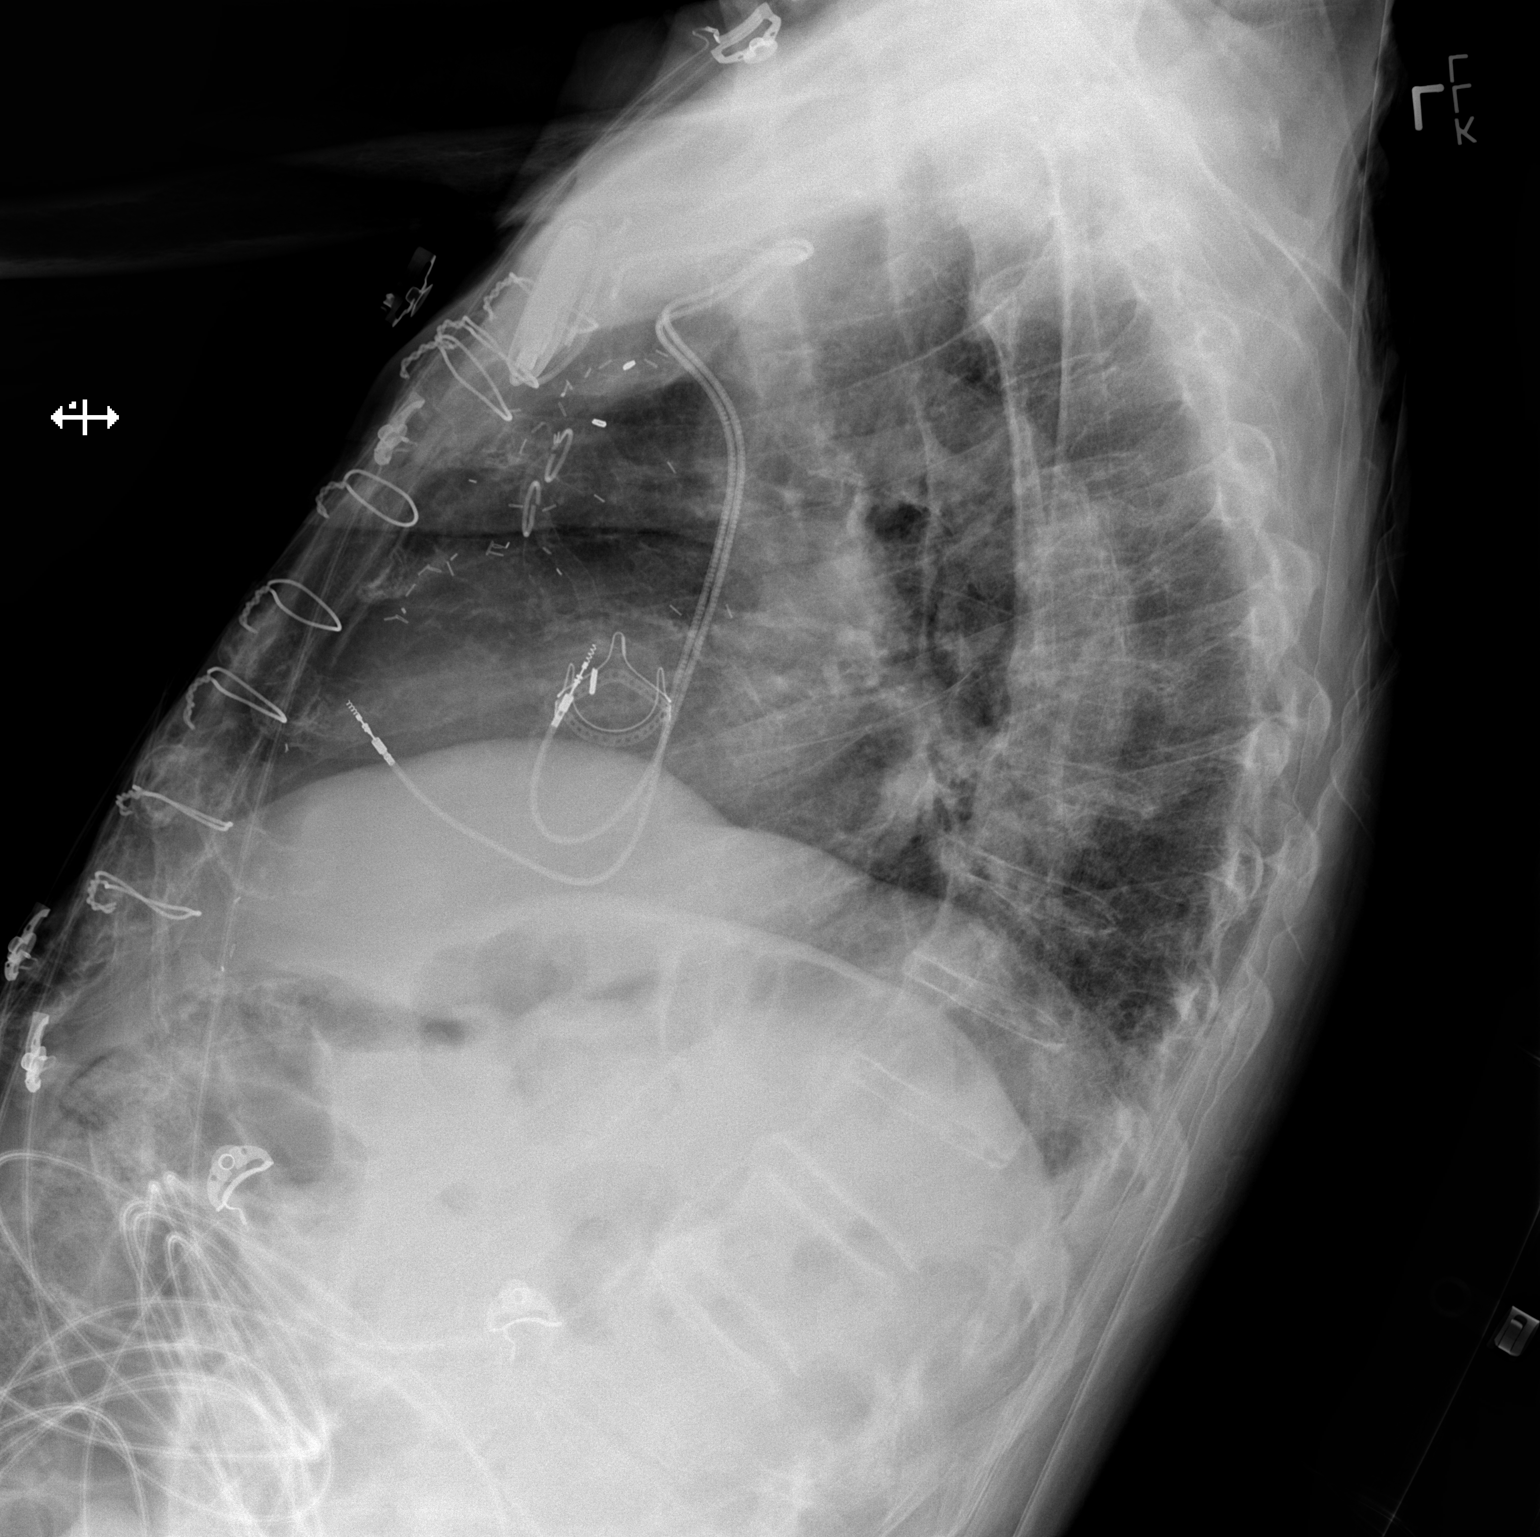

[x chest ap]
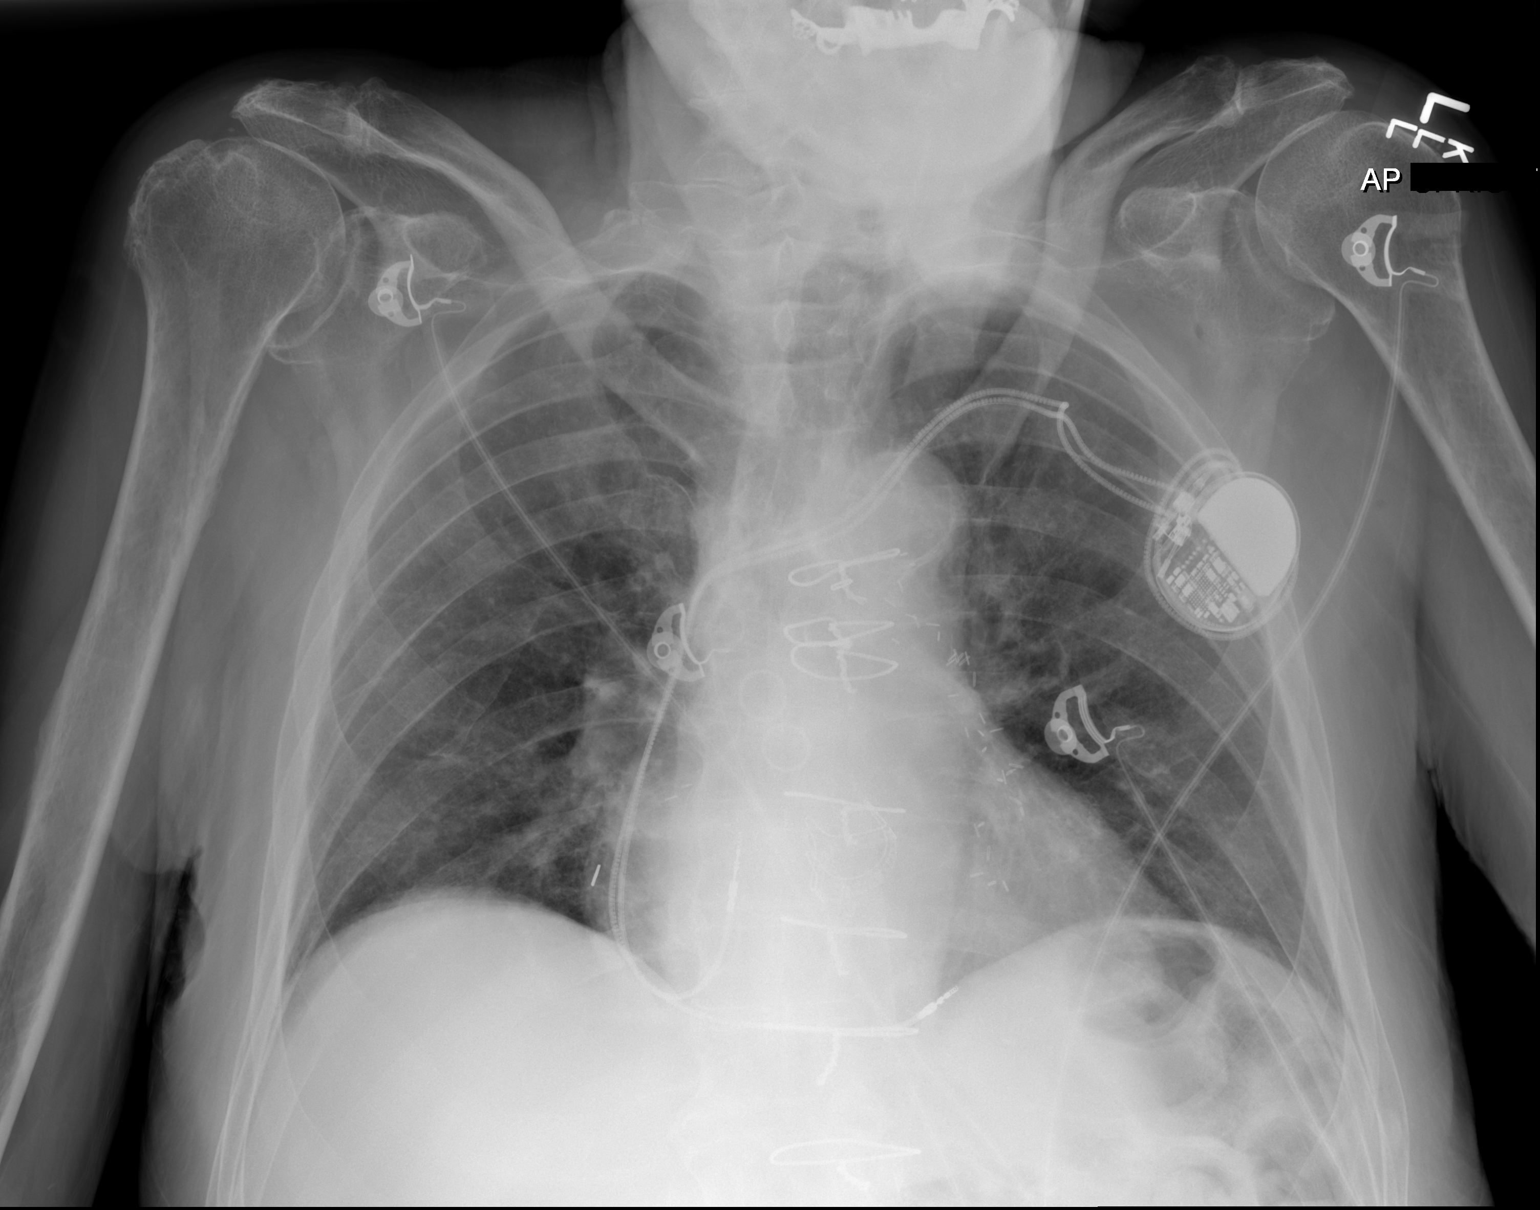

[2 of 2 positions shown; findings below may reference images not displayed]

FINDINGS: The lungs are mildly hypoexpanded but appear grossly clear. There is
no evidence of focal opacification, pleural effusion or
pneumothorax.

The heart is normal in size; the patient is status post median
sternotomy, with evidence of prior CABG. A pacemaker is noted at the
left chest wall, with leads ending at the right atrium and right
ventricle. No acute osseous abnormalities are seen.
IMPRESSION: Lungs mildly hypoexpanded but grossly clear.
